# Patient Record
Sex: Male | Born: 1980 | Race: Black or African American | Hispanic: No | Marital: Married | Smoking: Never smoker
Health system: Southern US, Community
[De-identification: ages and names within clinical notes are randomized; demographics above are authoritative.]

## PROBLEM LIST (undated history)

## (undated) DIAGNOSIS — T7840XA Allergy, unspecified, initial encounter: Secondary | ICD-10-CM

## (undated) HISTORY — PX: OTHER SURGICAL HISTORY: SHX169

## (undated) HISTORY — DX: Allergy, unspecified, initial encounter: T78.40XA

---

## 2013-06-06 ENCOUNTER — Encounter (HOSPITAL_COMMUNITY): Payer: Self-pay | Admitting: Emergency Medicine

## 2013-06-06 ENCOUNTER — Emergency Department (HOSPITAL_COMMUNITY)
Admission: EM | Admit: 2013-06-06 | Discharge: 2013-06-06 | Disposition: A | Discharge status: Home / Self Care | Payer: No Typology Code available for payment source | Attending: Emergency Medicine | Admitting: Emergency Medicine

## 2013-06-06 DIAGNOSIS — Y92410 Unspecified street and highway as the place of occurrence of the external cause: Secondary | ICD-10-CM | POA: Insufficient documentation

## 2013-06-06 DIAGNOSIS — M542 Cervicalgia: Secondary | ICD-10-CM

## 2013-06-06 DIAGNOSIS — M545 Low back pain, unspecified: Secondary | ICD-10-CM

## 2013-06-06 DIAGNOSIS — Y9389 Activity, other specified: Secondary | ICD-10-CM | POA: Insufficient documentation

## 2013-06-06 DIAGNOSIS — IMO0002 Reserved for concepts with insufficient information to code with codable children: Secondary | ICD-10-CM | POA: Insufficient documentation

## 2013-06-06 DIAGNOSIS — S0993XA Unspecified injury of face, initial encounter: Secondary | ICD-10-CM | POA: Insufficient documentation

## 2013-06-06 MED ORDER — DIAZEPAM 5 MG PO TABS: 5.0000 mg | ORAL_TABLET | Freq: Two times a day (BID) | ORAL | Status: DC

## 2013-06-06 MED ORDER — IBUPROFEN 600 MG PO TABS: 600.0000 mg | ORAL_TABLET | Freq: Four times a day (QID) | ORAL | Status: DC | PRN
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
## 2013-06-07 NOTE — ED Provider Notes (Signed)
Medical screening examination/treatment/procedure(s) were performed by non-physician practitioner and as supervising physician I was immediately available for consultation/collaboration.   Audree Camel, MD 06/07/13 [phone]

## 2013-06-17 ENCOUNTER — Ambulatory Visit: Payer: 59 | Attending: Internal Medicine | Admitting: Physical Therapy

## 2013-06-17 DIAGNOSIS — M545 Low back pain, unspecified: Secondary | ICD-10-CM | POA: Insufficient documentation

## 2013-06-17 DIAGNOSIS — M542 Cervicalgia: Secondary | ICD-10-CM | POA: Insufficient documentation

## 2013-06-17 DIAGNOSIS — IMO0001 Reserved for inherently not codable concepts without codable children: Secondary | ICD-10-CM | POA: Insufficient documentation

## 2013-06-24 ENCOUNTER — Ambulatory Visit: Payer: 59 | Admitting: Physical Therapy

## 2013-06-26 ENCOUNTER — Ambulatory Visit: Payer: 59 | Admitting: Physical Therapy

## 2016-09-11 ENCOUNTER — Other Ambulatory Visit (HOSPITAL_COMMUNITY): Payer: Self-pay | Admitting: Urology

## 2016-09-11 DIAGNOSIS — E291 Testicular hypofunction: Secondary | ICD-10-CM

## 2016-09-20 ENCOUNTER — Ambulatory Visit (HOSPITAL_COMMUNITY)
Admission: RE | Admit: 2016-09-20 | Discharge: 2016-09-20 | Disposition: A | Discharge status: Home / Self Care | Payer: BLUE CROSS/BLUE SHIELD | Source: Ambulatory Visit | Attending: Urology | Admitting: Urology

## 2016-09-20 DIAGNOSIS — E291 Testicular hypofunction: Secondary | ICD-10-CM | POA: Insufficient documentation

## 2016-09-20 MED ORDER — GADOBENATE DIMEGLUMINE 529 MG/ML IV SOLN
10.0000 mL | Freq: Once | INTRAVENOUS | Status: AC | PRN
  Administered 2016-09-20: 8 mL via INTRAVENOUS

## 2016-09-29 DIAGNOSIS — N4611 Organic oligospermia: Secondary | ICD-10-CM | POA: Diagnosis not present

## 2016-09-29 DIAGNOSIS — E291 Testicular hypofunction: Secondary | ICD-10-CM | POA: Diagnosis not present

## 2016-09-29 DIAGNOSIS — N5201 Erectile dysfunction due to arterial insufficiency: Secondary | ICD-10-CM | POA: Diagnosis not present

## 2016-11-03 ENCOUNTER — Encounter: Payer: BLUE CROSS/BLUE SHIELD | Admitting: Internal Medicine

## 2016-11-03 DIAGNOSIS — J309 Allergic rhinitis, unspecified: Secondary | ICD-10-CM | POA: Diagnosis not present

## 2016-11-03 DIAGNOSIS — E785 Hyperlipidemia, unspecified: Secondary | ICD-10-CM | POA: Diagnosis not present

## 2016-11-03 DIAGNOSIS — E559 Vitamin D deficiency, unspecified: Secondary | ICD-10-CM | POA: Diagnosis not present

## 2016-11-03 DIAGNOSIS — Z131 Encounter for screening for diabetes mellitus: Secondary | ICD-10-CM | POA: Diagnosis not present

## 2016-11-03 DIAGNOSIS — Z113 Encounter for screening for infections with a predominantly sexual mode of transmission: Secondary | ICD-10-CM | POA: Diagnosis not present

## 2016-11-03 DIAGNOSIS — Z0000 Encounter for general adult medical examination without abnormal findings: Secondary | ICD-10-CM | POA: Diagnosis not present

## 2016-11-03 DIAGNOSIS — Z136 Encounter for screening for cardiovascular disorders: Secondary | ICD-10-CM | POA: Diagnosis not present

## 2016-11-03 DIAGNOSIS — R7303 Prediabetes: Secondary | ICD-10-CM | POA: Diagnosis not present

## 2016-11-03 DIAGNOSIS — Z01118 Encounter for examination of ears and hearing with other abnormal findings: Secondary | ICD-10-CM | POA: Diagnosis not present

## 2016-11-03 DIAGNOSIS — E291 Testicular hypofunction: Secondary | ICD-10-CM | POA: Diagnosis not present

## 2016-12-15 DIAGNOSIS — J309 Allergic rhinitis, unspecified: Secondary | ICD-10-CM | POA: Diagnosis not present

## 2016-12-15 DIAGNOSIS — R079 Chest pain, unspecified: Secondary | ICD-10-CM | POA: Diagnosis not present

## 2016-12-15 DIAGNOSIS — R7303 Prediabetes: Secondary | ICD-10-CM | POA: Diagnosis not present

## 2016-12-15 DIAGNOSIS — E291 Testicular hypofunction: Secondary | ICD-10-CM | POA: Diagnosis not present

## 2016-12-15 DIAGNOSIS — E785 Hyperlipidemia, unspecified: Secondary | ICD-10-CM | POA: Diagnosis not present

## 2017-01-04 ENCOUNTER — Ambulatory Visit (INDEPENDENT_AMBULATORY_CARE_PROVIDER_SITE_OTHER): Payer: BLUE CROSS/BLUE SHIELD | Admitting: Family Medicine

## 2017-01-04 ENCOUNTER — Encounter: Payer: Self-pay | Admitting: Family Medicine

## 2017-01-04 VITALS — BP 128/84 | HR 82 | Temp 98.7°F | Resp 16 | Ht 72.0 in | Wt 178.0 lb

## 2017-01-04 DIAGNOSIS — R079 Chest pain, unspecified: Secondary | ICD-10-CM

## 2017-01-04 NOTE — Patient Instructions (Addendum)
It was very good to see you today.  I do think that the chest pain is coming from the muscles or cartilage of your chest, which is not bad.  However you do have an abnormal EKG. Because of this plus the pain in your chest, I'm going to send you to a heart doctor.  They will call you next week to set up an appointment to see you.  If you start having worsening chest pain, crushing chest pain, chest pain that does not go away, difficulty breathing, chest pain with lightheadedness or nausea or vomiting, you should go to the emergency room immediately.    IF you received an x-ray today, you will receive an invoice from [facility]. Please contact [facility] at [phone] with questions or concerns regarding your invoice.   IF you received labwork today, you will receive an invoice from [facility]. Please contact [facility] at [phone] with questions or concerns regarding your invoice.   Our billing staff will not be able to assist you with questions regarding bills from these companies.  You will be contacted with the lab results as soon as they are available. The fastest way to get your results is to activate your My Chart account. Instructions are located on the last page of this paperwork. If you have not heard from us regarding the results in 2 weeks, please contact this office.

## 2017-01-04 NOTE — Progress Notes (Signed)
   Allen [name] is a 36 y.o. male who presents to [facility] at [facility] today for chest pain:  1.  Chest pain:  Patient describes about 2 weeks of chest pain. It is intermittent in nature. He notices it mostly when he is stretches and he describes this as a "pulling" in his chest. It lasts for 1-5 minutes and then resolves. He has never had to take anything over-the-counter for this.  He is not doing exercise. No injury to his chest.  Has pain fleetingly this AM when he awoke, but none since then.  Several days since last episode of pain.    He does have another primary care physician. He was prescribed an unknown medication but states this did not help. He denies any dyspnea on exertion. No chest pain on exertion.  When he is having the chest pain he does describe blurry vision and "severe" headache. Again this revision headache resolves with this chest pain resolves. He denies any palpitations. No fevers or chills. No cough. He has no nausea vomiting when he experiences the chest pain.  Social: He is a never smoker. Occasional alcohol use. No illicit drug use.  Family history: Father with hypertension. He otherwise denies any family history of medical issues. Siblings are all healthy. No history of heart disease in family that he knows of.  ROS as above.     PMH reviewed. Patient is a nonsmoker.   No past medical history on file. No past surgical history on file.  Medications reviewed.    Physical Exam:  BP 128/84   Pulse 82   Temp 98.7 F (37.1 C) (Oral)   Resp 16   Ht 6' (1.829 m)   Wt 178 lb (80.7 kg)   SpO2 97%   BMI 24.14 kg/m  Gen:  Alert, cooperative patient who appears stated age in no acute distress.  Vital signs reviewed. HEENT: EOMI,  MMM Neck:  No JVD Pulm:  Clear to auscultation bilaterally with good air movement.  No wheezes or rales noted.   Cardiac:  Regular rate and rhythm without murmur auscultated.  Good S1/S2. Chest;  I cannot reproduce his pain with  palpation of his chest. Abd:  Soft/nondistended/nontender.   Exts: Non edematous BL  LE, warm and well perfused.  Neuro:  Awake and alert.  No focal deficits.   Assessment and Plan:  1.  Chest pain - Atypical chest pain. -Sounds mostly musculoskeletal.  Very low risk patient as he is the family history, personal history of cardiac disease. Never smoker. No hypertension or diabetes. -EKG was obtained before I saw the patient. He does have flipped T waves in his inferior leads. He also has evidence of left atrial enlargement on EKG. - He has none of the typical symptoms of chest pain, but he does have an abnormal EKG. -Because of this abnormal EKG plan to refer him to cardiologist for further evaluation.  -We are obtaining a complete metabolic profile, CBC, TSH today.  Also checking lipids.   - red flags/return precautions provided.

## 2017-01-05 LAB — COMPREHENSIVE METABOLIC PANEL
ALT: 22 IU/L (ref 0–44)
AST: 20 IU/L (ref 0–40)
Albumin/Globulin Ratio: 1.5 (ref 1.2–2.2)
Albumin: 4.3 g/dL (ref 3.5–5.5)
Alkaline Phosphatase: 67 IU/L (ref 39–117)
BUN/Creatinine Ratio: 16 (ref 9–20)
BUN: 16 mg/dL (ref 6–20)
Bilirubin Total: 0.3 mg/dL (ref 0.0–1.2)
CO2: 26 mmol/L (ref 18–29)
Calcium: 9.4 mg/dL (ref 8.7–10.2)
Chloride: 102 mmol/L (ref 96–106)
Creatinine, Ser: 0.99 mg/dL (ref 0.76–1.27)
GFR calc Af Amer: 115 mL/min/{1.73_m2} (ref >59)
GFR calc non Af Amer: 100 mL/min/{1.73_m2} (ref >59)
Globulin, Total: 2.8 g/dL (ref 1.5–4.5)
Glucose: 94 mg/dL (ref 65–99)
Potassium: 4.4 mmol/L (ref 3.5–5.2)
Sodium: 141 mmol/L (ref 134–144)
Total Protein: 7.1 g/dL (ref 6.0–8.5)

## 2017-01-05 LAB — CBC
Hematocrit: 47.8 % (ref 37.5–51.0)
Hemoglobin: 15.5 g/dL (ref 13.0–17.7)
MCH: 29 pg (ref 26.6–33.0)
MCHC: 32.4 g/dL (ref 31.5–35.7)
MCV: 90 fL (ref 79–97)
Platelets: 190 10*3/uL (ref 150–379)
RBC: 5.34 x10E6/uL (ref 4.14–5.80)
RDW: 15 % (ref 12.3–15.4)
WBC: 5.1 10*3/uL (ref 3.4–10.8)

## 2017-01-05 LAB — LIPID PANEL
Chol/HDL Ratio: 3.1 ratio (ref 0.0–5.0)
Cholesterol, Total: 178 mg/dL (ref 100–199)
HDL: 58 mg/dL (ref >39)
LDL Calculated: 99 mg/dL (ref 0–99)
Triglycerides: 104 mg/dL (ref 0–149)
VLDL Cholesterol Cal: 21 mg/dL (ref 5–40)

## 2017-01-05 LAB — TSH: TSH: 0.901 u[IU]/mL (ref 0.450–4.500)

## 2017-01-22 DIAGNOSIS — E7800 Pure hypercholesterolemia, unspecified: Secondary | ICD-10-CM | POA: Diagnosis not present

## 2017-01-22 DIAGNOSIS — R0789 Other chest pain: Secondary | ICD-10-CM | POA: Diagnosis not present

## 2017-01-22 DIAGNOSIS — R9431 Abnormal electrocardiogram [ECG] [EKG]: Secondary | ICD-10-CM | POA: Diagnosis not present

## 2017-01-22 DIAGNOSIS — R030 Elevated blood-pressure reading, without diagnosis of hypertension: Secondary | ICD-10-CM | POA: Diagnosis not present

## 2017-01-22 DIAGNOSIS — N5201 Erectile dysfunction due to arterial insufficiency: Secondary | ICD-10-CM | POA: Diagnosis not present

## 2017-01-26 DIAGNOSIS — N5201 Erectile dysfunction due to arterial insufficiency: Secondary | ICD-10-CM | POA: Diagnosis not present

## 2017-01-26 DIAGNOSIS — E291 Testicular hypofunction: Secondary | ICD-10-CM | POA: Diagnosis not present

## 2017-01-26 DIAGNOSIS — N4611 Organic oligospermia: Secondary | ICD-10-CM | POA: Diagnosis not present

## 2017-02-05 DIAGNOSIS — R0789 Other chest pain: Secondary | ICD-10-CM | POA: Diagnosis not present

## 2017-02-05 DIAGNOSIS — R9431 Abnormal electrocardiogram [ECG] [EKG]: Secondary | ICD-10-CM | POA: Diagnosis not present

## 2017-02-15 DIAGNOSIS — R0789 Other chest pain: Secondary | ICD-10-CM | POA: Diagnosis not present

## 2017-02-15 DIAGNOSIS — R9431 Abnormal electrocardiogram [ECG] [EKG]: Secondary | ICD-10-CM | POA: Diagnosis not present

## 2017-02-23 DIAGNOSIS — E7800 Pure hypercholesterolemia, unspecified: Secondary | ICD-10-CM | POA: Diagnosis not present

## 2017-02-23 DIAGNOSIS — R030 Elevated blood-pressure reading, without diagnosis of hypertension: Secondary | ICD-10-CM | POA: Diagnosis not present

## 2017-02-23 DIAGNOSIS — R9431 Abnormal electrocardiogram [ECG] [EKG]: Secondary | ICD-10-CM | POA: Diagnosis not present

## 2017-02-23 DIAGNOSIS — R0789 Other chest pain: Secondary | ICD-10-CM | POA: Diagnosis not present

## 2017-05-23 DIAGNOSIS — Z31430 Encounter of female for testing for genetic disease carrier status for procreative management: Secondary | ICD-10-CM | POA: Diagnosis not present

## 2017-05-23 DIAGNOSIS — E291 Testicular hypofunction: Secondary | ICD-10-CM | POA: Diagnosis not present

## 2017-05-23 DIAGNOSIS — E282 Polycystic ovarian syndrome: Secondary | ICD-10-CM | POA: Diagnosis not present

## 2017-05-23 DIAGNOSIS — Z319 Encounter for procreative management, unspecified: Secondary | ICD-10-CM | POA: Diagnosis not present

## 2017-07-16 ENCOUNTER — Encounter: Payer: Self-pay | Admitting: Emergency Medicine

## 2017-07-16 ENCOUNTER — Ambulatory Visit (INDEPENDENT_AMBULATORY_CARE_PROVIDER_SITE_OTHER): Payer: BLUE CROSS/BLUE SHIELD

## 2017-07-16 ENCOUNTER — Other Ambulatory Visit: Payer: Self-pay

## 2017-07-16 ENCOUNTER — Ambulatory Visit: Payer: BLUE CROSS/BLUE SHIELD | Admitting: Emergency Medicine

## 2017-07-16 VITALS — BP 128/76 | HR 76 | Temp 98.3°F | Resp 16 | Ht 70.0 in | Wt 173.4 lb

## 2017-07-16 DIAGNOSIS — R079 Chest pain, unspecified: Secondary | ICD-10-CM | POA: Diagnosis not present

## 2017-07-16 NOTE — Patient Instructions (Addendum)
   IF you received an x-ray today, you will receive an invoice from [facility]. Please contact [facility] at [phone] with questions or concerns regarding your invoice.   IF you received labwork today, you will receive an invoice from [facility]. Please contact [facility] at [phone] with questions or concerns regarding your invoice.   Our billing staff will not be able to assist you with questions regarding bills from these companies.  You will be contacted with the lab results as soon as they are available. The fastest way to get your results is to activate your My Chart account. Instructions are located on the last page of this paperwork. If you have not heard from us regarding the results in 2 weeks, please contact this office.     Nonspecific Chest Pain Chest pain can be caused by many different conditions. There is a chance that your pain could be related to something serious, such as a heart attack or a blood clot in your lungs. Chest pain can also be caused by conditions that are not life-threatening. If you have chest pain, it is very important to follow up with your doctor. Follow these instructions at home: Medicines  If you were prescribed an antibiotic medicine, take it as told by your doctor. Do not stop taking the antibiotic even if you start to feel better.  Take over-the-counter and prescription medicines only as told by your doctor. Lifestyle  Do not use any products that contain nicotine or tobacco, such as cigarettes and e-cigarettes. If you need help quitting, ask your doctor.  Do not drink alcohol.  Make lifestyle changes as told by your doctor. These may include: ? Getting regular exercise. Ask your doctor for some activities that are safe for you. ? Eating a heart-healthy diet. A diet specialist (dietitian) can help you to learn healthy eating options. ? Staying at a healthy weight. ? Managing diabetes, if needed. ? Lowering your  stress, as with deep breathing or spending time in nature. General instructions  Avoid any activities that make you feel chest pain.  If your chest pain is because of heartburn: ? Raise (elevate) the head of your bed about 6 inches (15 cm). You can do this by putting blocks under the bed legs at the head of the bed. ? Do not sleep with extra pillows under your head. That does not help heartburn.  Keep all follow-up visits as told by your doctor. This is important. This includes any further testing if your chest pain does not go away. Contact a doctor if:  Your chest pain does not go away.  You have a rash with blisters on your chest.  You have a fever.  You have chills. Get help right away if:  Your chest pain is worse.  You have a cough that gets worse, or you cough up blood.  You have very bad (severe) pain in your belly (abdomen).  You are very weak.  You pass out (faint).  You have either of these for no clear reason: ? Sudden chest discomfort. ? Sudden discomfort in your arms, back, neck, or jaw.  You have shortness of breath at any time.  You suddenly start to sweat, or your skin gets clammy.  You feel sick to your stomach (nauseous).  You throw up (vomit).  You suddenly feel light-headed or dizzy.  Your heart starts to beat fast, or it feels like it is skipping beats. These symptoms may be an emergency. Do not wait   to see if the symptoms will go away. Get medical help right away. Call your local emergency services (911 in the [place] Do not drive yourself to the hospital. This information is not intended to replace advice given to you by your health care provider. Make sure you discuss any questions you have with your health care provider. Document Released: 02/07/2008 Document Revised: 05/15/2016 Document Reviewed: 05/15/2016 Elsevier Interactive Patient Education  2017 Elsevier Inc.  

## 2017-07-16 NOTE — Progress Notes (Signed)
Allen [name] 36 y.o.   Chief Complaint  Patient presents with  . Chest Pain    x 1 week radiating to the back     HISTORY OF PRESENT ILLNESS: This is a 36 y.o. male complaining of chest pain x 1 week similar to one he was evaluated for last May; was seen here and referred to Cardiology; had stress test and Echocardiogram done last summer and told by cardiologist everything was fine. Pain is sharp and pleuritic without associated symptoms. No CAD risk factors.  Chest Pain   This is a recurrent problem. The current episode started 1 to 4 weeks ago. The onset quality is undetermined. The problem occurs intermittently. The problem has been waxing and waning. The pain is present in the substernal region. The pain is at a severity of 2/10. The pain is mild. The quality of the pain is described as sharp. The pain radiates to the upper back and mid back. Associated symptoms include headaches. Pertinent negatives include no abdominal pain, back pain, claudication, cough, diaphoresis, dizziness, exertional chest pressure, fever, hemoptysis, irregular heartbeat, lower extremity edema, malaise/fatigue, nausea, numbness, palpitations, shortness of breath, syncope, vomiting or weakness.     Prior to Admission medications   Medication Sig Start Date End Date Taking? Authorizing Provider  ibuprofen (ADVIL,MOTRIN) 600 MG tablet Take 1 tablet (600 mg total) by mouth every 6 (six) hours as needed for pain. 06/06/13  Yes Lurene Shadow, PA-C  acetaminophen (TYLENOL) 325 MG tablet Take 650 mg by mouth every 6 (six) hours as needed for pain (pain).    [provider]  clomiPHENE (CLOMID) 50 MG tablet  10/30/16   [provider]  diazepam (VALIUM) 5 MG tablet Take 1 tablet (5 mg total) by mouth 2 (two) times daily. Patient not taking: Reported on 01/04/2017 06/06/13   Lurene Shadow, PA-C    No Known Allergies  There are no active problems to display for this patient.   No past medical history  on file.  No past surgical history on file.  Social History   Socioeconomic History  . Marital status: Married    Spouse name: Not on file  . Number of children: Not on file  . Years of education: Not on file  . Highest education level: Not on file  Social Needs  . Financial resource strain: Not on file  . Food insecurity - worry: Not on file  . Food insecurity - inability: Not on file  . Transportation needs - medical: Not on file  . Transportation needs - non-medical: Not on file  Occupational History  . Not on file  Tobacco Use  . Smoking status: Never Smoker  . Smokeless tobacco: Never Used  Substance and Sexual Activity  . Alcohol use: Yes    Comment: Occassionlly   . Drug use: No  . Sexual activity: Not on file  Other Topics Concern  . Not on file  Social History Narrative  . Not on file    No family history on file.   Review of Systems  Constitutional: Negative for diaphoresis, fever, malaise/fatigue and weight loss.  HENT: Negative.  Negative for sore throat.   Eyes: Negative.  Negative for discharge and redness.  Respiratory: Negative for cough, hemoptysis, shortness of breath and wheezing.   Cardiovascular: Positive for chest pain. Negative for palpitations, claudication, leg swelling and syncope.  Gastrointestinal: Negative for abdominal pain, blood in stool, diarrhea, nausea and vomiting.  Genitourinary: Negative for dysuria and hematuria.  Musculoskeletal: Negative for back pain, myalgias and neck pain.  Skin: Negative.  Negative for rash.  Neurological: Positive for headaches. Negative for dizziness, sensory change, focal weakness, weakness and numbness.  Endo/Heme/Allergies: Negative.   All other systems reviewed and are negative.  Vitals:   07/16/17 1208  BP: 128/76  Pulse: 76  Resp: 16  Temp: 98.3 F (36.8 C)  SpO2: 99%     Physical Exam  Constitutional: He is oriented to person, place, and time. He appears well-developed and  well-nourished.  HENT:  Head: Normocephalic and atraumatic.  Nose: Nose normal.  Mouth/Throat: Oropharynx is clear and moist.  Eyes: Conjunctivae and EOM are normal. Pupils are equal, round, and reactive to light.  Neck: Normal range of motion. Neck supple. No JVD present.  Cardiovascular: Normal rate, regular rhythm, normal heart sounds and intact distal pulses.  Pulmonary/Chest: Effort normal and breath sounds normal.  Abdominal: Soft. Bowel sounds are normal. He exhibits no distension. There is no tenderness.  Musculoskeletal: Normal range of motion.  Neurological: He is alert and oriented to person, place, and time. No sensory deficit. He exhibits normal muscle tone.  Skin: Skin is warm and dry. Capillary refill takes less than 2 seconds. No rash noted.  Psychiatric: He has a normal mood and affect. His behavior is normal.  Vitals reviewed.  Dg Chest 2 View  Result Date: 07/16/2017 CLINICAL DATA:  Chest pain.  Nonsmoker. EXAM: CHEST  2 VIEW COMPARISON:  None in PACs FINDINGS: The lungs are adequately inflated. There is no focal infiltrate. There is no alveolar infiltrate or pleural effusion. The heart and pulmonary vascularity are normal. The trachea is midline. The bony thorax is unremarkable. IMPRESSION: There is no acute cardiopulmonary abnormality. Electronically Signed   By: David  SwazilandJordan M.D.   On: 07/16/2017 12:39    NSR with VR65 No acute ischemic changes PR114 QRS368 compared with5/3/18; no changes.  ASSESSMENT & PLAN: Allen DeutscherMartin was seen today for chest pain.  Diagnoses and all orders for this visit:  Chest pain, unspecified type -     EKG 12-Lead -     DG Chest 2 View; Future    Patient Instructions       IF you received an x-ray today, you will receive an invoice from [facility]. Please contact [facility] at [phone] with questions or concerns regarding your invoice.   IF you received labwork today, you will receive an invoice from  [facility]. Please contact [facility] at [phone] with questions or concerns regarding your invoice.   Our billing staff will not be able to assist you with questions regarding bills from these companies.  You will be contacted with the lab results as soon as they are available. The fastest way to get your results is to activate your My Chart account. Instructions are located on the last page of this paperwork. If you have not heard from us regarding the results in 2 weeks, please contact this office.     Nonspecific Chest Pain Chest pain can be caused by many different conditions. There is a chance that your pain could be related to something serious, such as a heart attack or a blood clot in your lungs. Chest pain can also be caused by conditions that are not life-threatening. If you have chest pain, it is very important to follow up with your doctor. Follow these instructions at home: Medicines  If you were prescribed an antibiotic medicine, take it as told by your doctor. Do not stop taking  the antibiotic even if you start to feel better.  Take over-the-counter and prescription medicines only as told by your doctor. Lifestyle  Do not use any products that contain nicotine or tobacco, such as cigarettes and e-cigarettes. If you need help quitting, ask your doctor.  Do not drink alcohol.  Make lifestyle changes as told by your doctor. These may include: ? Getting regular exercise. Ask your doctor for some activities that are safe for you. ? Eating a heart-healthy diet. A diet specialist (dietitian) can help you to learn healthy eating options. ? Staying at a healthy weight. ? Managing diabetes, if needed. ? Lowering your stress, as with deep breathing or spending time in nature. General instructions  Avoid any activities that make you feel chest pain.  If your chest pain is because of heartburn: ? Raise (elevate) the head of your bed about 6 inches (15 cm). You can do this by  putting blocks under the bed legs at the head of the bed. ? Do not sleep with extra pillows under your head. That does not help heartburn.  Keep all follow-up visits as told by your doctor. This is important. This includes any further testing if your chest pain does not go away. Contact a doctor if:  Your chest pain does not go away.  You have a rash with blisters on your chest.  You have a fever.  You have chills. Get help right away if:  Your chest pain is worse.  You have a cough that gets worse, or you cough up blood.  You have very bad (severe) pain in your belly (abdomen).  You are very weak.  You pass out (faint).  You have either of these for no clear reason: ? Sudden chest discomfort. ? Sudden discomfort in your arms, back, neck, or jaw.  You have shortness of breath at any time.  You suddenly start to sweat, or your skin gets clammy.  You feel sick to your stomach (nauseous).  You throw up (vomit).  You suddenly feel light-headed or dizzy.  Your heart starts to beat fast, or it feels like it is skipping beats. These symptoms may be an emergency. Do not wait to see if the symptoms will go away. Get medical help right away. Call your local emergency services (911 in the [place] Do not drive yourself to the hospital. This information is not intended to replace advice given to you by your health care provider. Make sure you discuss any questions you have with your health care provider. Document Released: 02/07/2008 Document Revised: 05/15/2016 Document Reviewed: 05/15/2016 Elsevier Interactive Patient Education  2017 Elsevier Inc.      Edwina BarthMiguel Kaylum Shrum, MD [facility]

## 2017-12-13 ENCOUNTER — Encounter: Payer: BLUE CROSS/BLUE SHIELD | Admitting: Podiatry

## 2017-12-13 ENCOUNTER — Ambulatory Visit: Payer: BLUE CROSS/BLUE SHIELD

## 2018-01-02 NOTE — Progress Notes (Signed)
This encounter was created in error - please disregard.

## 2018-04-18 DIAGNOSIS — M21612 Bunion of left foot: Secondary | ICD-10-CM | POA: Diagnosis not present

## 2018-04-18 DIAGNOSIS — M21961 Unspecified acquired deformity of right lower leg: Secondary | ICD-10-CM | POA: Diagnosis not present

## 2018-04-18 DIAGNOSIS — M21611 Bunion of right foot: Secondary | ICD-10-CM | POA: Diagnosis not present

## 2018-04-18 DIAGNOSIS — M21962 Unspecified acquired deformity of left lower leg: Secondary | ICD-10-CM | POA: Diagnosis not present

## 2018-07-05 ENCOUNTER — Emergency Department (HOSPITAL_COMMUNITY)
Admission: EM | Admit: 2018-07-05 | Discharge: 2018-07-06 | Disposition: A | Discharge status: Home / Self Care | Payer: BLUE CROSS/BLUE SHIELD | Attending: Emergency Medicine | Admitting: Emergency Medicine

## 2018-07-05 ENCOUNTER — Encounter (HOSPITAL_COMMUNITY): Payer: Self-pay | Admitting: Emergency Medicine

## 2018-07-05 ENCOUNTER — Emergency Department (HOSPITAL_COMMUNITY): Payer: BLUE CROSS/BLUE SHIELD

## 2018-07-05 DIAGNOSIS — R51 Headache: Secondary | ICD-10-CM | POA: Diagnosis not present

## 2018-07-05 DIAGNOSIS — H538 Other visual disturbances: Secondary | ICD-10-CM | POA: Insufficient documentation

## 2018-07-05 DIAGNOSIS — R519 Headache, unspecified: Secondary | ICD-10-CM

## 2018-07-05 LAB — COMPREHENSIVE METABOLIC PANEL
ALBUMIN: 4.4 g/dL (ref 3.5–5.0)
ALT: 23 U/L (ref 0–44)
ANION GAP: 6 (ref 5–15)
AST: 22 U/L (ref 15–41)
Alkaline Phosphatase: 57 U/L (ref 38–126)
BILIRUBIN TOTAL: 0.8 mg/dL (ref 0.3–1.2)
BUN: 14 mg/dL (ref 6–20)
CALCIUM: 9.6 mg/dL (ref 8.9–10.3)
CO2: 29 mmol/L (ref 22–32)
CREATININE: 1.13 mg/dL (ref 0.61–1.24)
Chloride: 102 mmol/L (ref 98–111)
GFR calc Af Amer: >60 mL/min (ref >60)
GFR calc non Af Amer: >60 mL/min (ref >60)
GLUCOSE: 110 mg/dL — AB (ref 70–99)
Potassium: 4.6 mmol/L (ref 3.5–5.1)
Sodium: 137 mmol/L (ref 135–145)
Total Protein: 7.9 g/dL (ref 6.5–8.1)

## 2018-07-05 LAB — CBC
HCT: 46.6 % (ref 39.0–52.0)
Hemoglobin: 15.4 g/dL (ref 13.0–17.0)
MCH: 28.6 pg (ref 26.0–34.0)
MCHC: 33 g/dL (ref 30.0–36.0)
MCV: 86.5 fL (ref 80.0–100.0)
Platelets: 186 10*3/uL (ref 150–400)
RBC: 5.39 MIL/uL (ref 4.22–5.81)
RDW: 13.1 % (ref 11.5–15.5)
WBC: 6.2 10*3/uL (ref 4.0–10.5)
nRBC: 0 % (ref 0.0–0.2)

## 2018-07-05 LAB — PROTIME-INR
INR: 1.05
PROTHROMBIN TIME: 13.6 s (ref 11.4–15.2)

## 2018-07-05 LAB — I-STAT TROPONIN, ED: Troponin i, poc: 0 ng/mL (ref 0.00–0.08)

## 2018-07-05 LAB — DIFFERENTIAL
ABS IMMATURE GRANULOCYTES: 0.01 10*3/uL (ref 0.00–0.07)
BASOS ABS: 0 10*3/uL (ref 0.0–0.1)
Basophils Relative: 0 %
Eosinophils Absolute: 0.2 10*3/uL (ref 0.0–0.5)
Eosinophils Relative: 4 %
Immature Granulocytes: 0 %
LYMPHS ABS: 1.7 10*3/uL (ref 0.7–4.0)
Lymphocytes Relative: 28 %
Monocytes Absolute: 0.3 10*3/uL (ref 0.1–1.0)
Monocytes Relative: 5 %
NEUTROS ABS: 3.9 10*3/uL (ref 1.7–7.7)
Neutrophils Relative %: 63 %

## 2018-07-05 LAB — APTT: aPTT: 34 seconds (ref 24–36)

## 2018-07-05 NOTE — ED Provider Notes (Signed)
[facility] EMERGENCY DEPARTMENT Provider Note   CSN: [id number] Arrival date & time: 07/05/18  2118     History   Chief Complaint Chief Complaint  Patient presents with  . Headache    HPI Allen [name] is a 37 y.o. male.  Patient presents to the ER because he had an episode of sudden onset headache and blurred vision while he was lifting weights.  Patient reports that he just finished performing bench press, put the weight back and then had sudden onset of the symptoms.  This has slowly improved over time and he is now essentially back to his baseline.  He did have his blood pressure checked at the time of onset and it was somewhat elevated, does not have a history of hypertension.  Patient denies any chest pain, heart palpitations, shortness of breath, passing out.     History reviewed. No pertinent past medical history.  Patient Active Problem List   Diagnosis Date Noted  . Chest pain 07/16/2017    History reviewed. No pertinent surgical history.      Home Medications    Prior to Admission medications   Medication Sig Start Date End Date Taking? Authorizing Provider  diazepam (VALIUM) 5 MG tablet Take 1 tablet (5 mg total) by mouth 2 (two) times daily. Patient not taking: Reported on 01/04/2017 06/06/13   Lurene Shadow, PA-C  ibuprofen (ADVIL,MOTRIN) 600 MG tablet Take 1 tablet (600 mg total) by mouth every 6 (six) hours as needed for pain. Patient not taking: Reported on 07/05/2018 06/06/13   Rolla Plate    Family History No family history on file.  Social History Social History   Tobacco Use  . Smoking status: Never Smoker  . Smokeless tobacco: Never Used  Substance Use Topics  . Alcohol use: Yes    Comment: Occassionlly   . Drug use: No     Allergies   Patient has no known allergies.   Review of Systems Review of Systems  Eyes: Positive for visual disturbance.  Neurological: Positive for headaches.  All other systems  reviewed and are negative.    Physical Exam Updated Vital Signs BP (!) 130/94 (BP Location: Right Arm)   Pulse (!) 56   Temp 98.1 F (36.7 C) (Oral)   Resp 16   Ht 5\' 6"  (1.676 m)   Wt 77.1 kg   SpO2 100%   BMI 27.44 kg/m   Physical Exam  Constitutional: He is oriented to person, place, and time. He appears well-developed and well-nourished. No distress.  HENT:  Head: Normocephalic and atraumatic.  Right Ear: Hearing normal.  Left Ear: Hearing normal.  Nose: Nose normal.  Mouth/Throat: Oropharynx is clear and moist and mucous membranes are normal.  Eyes: Pupils are equal, round, and reactive to light. Conjunctivae and EOM are normal.  Neck: Normal range of motion. Neck supple.  Cardiovascular: Regular rhythm, S1 normal and S2 normal. Exam reveals no gallop and no friction rub.  No murmur heard. Pulmonary/Chest: Effort normal and breath sounds normal. No respiratory distress. He exhibits no tenderness.  Abdominal: Soft. Normal appearance and bowel sounds are normal. There is no hepatosplenomegaly. There is no tenderness. There is no rebound, no guarding, no tenderness at McBurney's point and negative Murphy's sign. No hernia.  Musculoskeletal: Normal range of motion.  Neurological: He is alert and oriented to person, place, and time. He has normal strength. No cranial nerve deficit or sensory deficit. Coordination normal. GCS eye subscore is 4.  GCS verbal subscore is 5. GCS motor subscore is 6.  Extraocular muscle movement: normal No visual field cut Pupils: equal and reactive both direct and consensual response is normal No nystagmus present    Sensory function is intact to light touch, pinprick Proprioception intact  Grip strength 5/5 symmetric in upper extremities No pronator drift Normal finger to nose bilaterally  Lower extremity strength 5/5 against gravity Normal heel to shin bilaterally  Gait: normal   Skin: Skin is warm, dry and intact. No rash noted. No  cyanosis.  Psychiatric: He has a normal mood and affect. His speech is normal and behavior is normal. Thought content normal.  Nursing note and vitals reviewed.    ED Treatments / Results  Labs (all labs ordered are listed, but only abnormal results are displayed) Labs Reviewed  COMPREHENSIVE METABOLIC PANEL - Abnormal; Notable for the following components:      Result Value   Glucose, Bld 110 (*)    All other components within normal limits  PROTIME-INR  APTT  CBC  DIFFERENTIAL  I-STAT TROPONIN, ED    EKG EKG Interpretation  Date/Time:  Friday July 05 2018 21:38:24 EDT Ventricular Rate:  58 PR Interval:  128 QRS Duration: 72 QT Interval:  404 QTC Calculation: 396 R Axis:   81 Text Interpretation:  Sinus bradycardia Biatrial enlargement Abnormal ECG Confirmed by Gilda Crease [phone]) on 07/05/2018 11:19:44 PM   Radiology Ct Head Wo Contrast  Result Date: 07/05/2018 CLINICAL DATA:  37 y/o  M; acute headache. EXAM: CT HEAD WITHOUT CONTRAST TECHNIQUE: Contiguous axial images were obtained from the base of the skull through the vertex without intravenous contrast. COMPARISON:  09/20/2016 MRI head. FINDINGS: Brain: No evidence of acute infarction, hemorrhage, hydrocephalus, extra-axial collection or mass lesion/mass effect. Vascular: No hyperdense vessel or unexpected calcification. Skull: Normal. Negative for fracture or focal lesion. Sinuses/Orbits: No acute finding. Other: None. IMPRESSION: Normal head CT. Electronically Signed   By: Mitzi Hansen M.D.   On: 07/05/2018 22:07    Procedures Procedures (including critical care time)  Medications Ordered in ED Medications - No data to display   Initial Impression / Assessment and Plan / ED Course  I have reviewed the triage vital signs and the nursing notes.  Pertinent labs & imaging results that were available during my care of the patient were reviewed by me and considered in my medical decision  making (see chart for details).     Patient presents to the emergency department for evaluation of headache and blurry vision that occurred suddenly after lifting weights.  Patient symptoms have slowly improved and he is now back to his baseline.  He has a normal neurologic evaluation.  Head CT is normal.  All blood work is unremarkable.  Patient is an otherwise healthy individual who is back to his baseline, does not require any further work-up.  Final Clinical Impressions(s) / ED Diagnoses   Final diagnoses:  Bad headache    ED Discharge Orders    None       Gilda Crease, MD 07/05/18 2356

## 2018-07-05 NOTE — ED Triage Notes (Signed)
Reports bad headache that started while lifting weights this evening in the gym.  Also endorses some blurred vision. No neuro deficits noted.

## 2018-11-11 DIAGNOSIS — Z31440 Encounter of male for testing for genetic disease carrier status for procreative management: Secondary | ICD-10-CM | POA: Diagnosis not present

## 2019-02-04 ENCOUNTER — Ambulatory Visit: Payer: BLUE CROSS/BLUE SHIELD | Admitting: Emergency Medicine

## 2019-02-04 DIAGNOSIS — Z1329 Encounter for screening for other suspected endocrine disorder: Secondary | ICD-10-CM | POA: Diagnosis not present

## 2019-02-04 DIAGNOSIS — E782 Mixed hyperlipidemia: Secondary | ICD-10-CM | POA: Diagnosis not present

## 2019-02-04 DIAGNOSIS — Z136 Encounter for screening for cardiovascular disorders: Secondary | ICD-10-CM | POA: Diagnosis not present

## 2019-02-04 DIAGNOSIS — Z01118 Encounter for examination of ears and hearing with other abnormal findings: Secondary | ICD-10-CM | POA: Diagnosis not present

## 2019-02-04 DIAGNOSIS — Z131 Encounter for screening for diabetes mellitus: Secondary | ICD-10-CM | POA: Diagnosis not present

## 2019-02-04 DIAGNOSIS — Z01021 Encounter for examination of eyes and vision following failed vision screening with abnormal findings: Secondary | ICD-10-CM | POA: Diagnosis not present

## 2019-02-04 DIAGNOSIS — Z0001 Encounter for general adult medical examination with abnormal findings: Secondary | ICD-10-CM | POA: Diagnosis not present

## 2019-02-04 DIAGNOSIS — R0789 Other chest pain: Secondary | ICD-10-CM | POA: Diagnosis not present

## 2019-02-11 ENCOUNTER — Other Ambulatory Visit: Payer: Self-pay

## 2019-02-11 ENCOUNTER — Ambulatory Visit: Payer: BC Managed Care – PPO | Admitting: Emergency Medicine

## 2019-02-11 ENCOUNTER — Encounter: Payer: Self-pay | Admitting: Emergency Medicine

## 2019-02-11 VITALS — BP 115/77 | HR 75 | Temp 98.9°F | Resp 16 | Ht 71.0 in | Wt 167.8 lb

## 2019-02-11 DIAGNOSIS — R079 Chest pain, unspecified: Secondary | ICD-10-CM | POA: Diagnosis not present

## 2019-02-11 DIAGNOSIS — Z23 Encounter for immunization: Secondary | ICD-10-CM | POA: Diagnosis not present

## 2019-02-11 DIAGNOSIS — K2100 Gastro-esophageal reflux disease with esophagitis, without bleeding: Secondary | ICD-10-CM | POA: Insufficient documentation

## 2019-02-11 MED ORDER — ESOMEPRAZOLE MAGNESIUM 40 MG PO CPDR: 40.0000 mg | DELAYED_RELEASE_CAPSULE | Freq: Every day | ORAL | 3 refills | Status: DC

## 2019-02-11 MED ORDER — FAMOTIDINE 40 MG PO TABS: 40.0000 mg | ORAL_TABLET | Freq: Every day | ORAL | 0 refills | Status: DC

## 2019-02-11 NOTE — Progress Notes (Signed)
Allen [name] 38 y.o.   Chief Complaint  Patient presents with  . Chest Pain    x 1 week - per patient feels like somethin in the throat, it start night  . Shoulder Pain    x 3 days LEFT shoulder blade area    HISTORY OF PRESENT ILLNESS: This is a 38 y.o. male complaining of burning chest pain for the past week along with burning throat.  No other significant symptoms.  Non-smoker.  Regular EtOH consumer.  Denies nausea or vomiting.  Occasional diarrhea.  Denies flulike symptoms, fever or chills.  Denies melena or rectal bleeding.  Pain is intermittent with some radiation from epigastric area up into his throat.  Denies any other significant symptoms.  Healthy man.  No chronic medical problems.  No significant cardiac risk factors.  HPI   Prior to Admission medications   Medication Sig Start Date End Date Taking? Authorizing Provider  Multiple Vitamin (MULTIVITAMIN) tablet Take 1 tablet by mouth as needed.   Yes [provider]  diazepam (VALIUM) 5 MG tablet Take 1 tablet (5 mg total) by mouth 2 (two) times daily. Patient not taking: Reported on 02/11/2019 06/06/13   Noe Gens, PA-C  ibuprofen (ADVIL,MOTRIN) 600 MG tablet Take 1 tablet (600 mg total) by mouth every 6 (six) hours as needed for pain. Patient not taking: Reported on 02/11/2019 06/06/13   Noe Gens, PA-C    No Known Allergies  There are no active problems to display for this patient.   History reviewed. No pertinent past medical history.  History reviewed. No pertinent surgical history.  Social History   Socioeconomic History  . Marital status: Married    Spouse name: Not on file  . Number of children: Not on file  . Years of education: Not on file  . Highest education level: Not on file  Occupational History  . Not on file  Social Needs  . Financial resource strain: Not on file  . Food insecurity:    Worry: Not on file    Inability: Not on file  . Transportation needs:    Medical: Not on  file    Non-medical: Not on file  Tobacco Use  . Smoking status: Never Smoker  . Smokeless tobacco: Never Used  Substance and Sexual Activity  . Alcohol use: Yes    Comment: Occassionlly   . Drug use: No  . Sexual activity: Not on file  Lifestyle  . Physical activity:    Days per week: Not on file    Minutes per session: Not on file  . Stress: Not on file  Relationships  . Social connections:    Talks on phone: Not on file    Gets together: Not on file    Attends religious service: Not on file    Active member of club or organization: Not on file    Attends meetings of clubs or organizations: Not on file    Relationship status: Not on file  . Intimate partner violence:    Fear of current or ex partner: Not on file    Emotionally abused: Not on file    Physically abused: Not on file    Forced sexual activity: Not on file  Other Topics Concern  . Not on file  Social History Narrative  . Not on file    History reviewed. No pertinent family history.   Review of Systems  Constitutional: Negative.  Negative for chills and fever.  HENT: Positive for  sore throat. Negative for congestion and sinus pain.   Eyes: Negative.  Negative for blurred vision and double vision.  Respiratory: Negative.  Negative for cough and shortness of breath.   Cardiovascular: Positive for chest pain. Negative for palpitations.  Gastrointestinal: Positive for abdominal pain, diarrhea and heartburn. Negative for blood in stool, melena, nausea and vomiting.  Genitourinary: Negative for dysuria and hematuria.  Musculoskeletal: Negative for back pain and myalgias.  Skin: Negative.   Neurological: Negative for dizziness, focal weakness, seizures, loss of consciousness and headaches.  Endo/Heme/Allergies: Negative.   All other systems reviewed and are negative.   Vitals:   02/11/19 0918  BP: 115/77  Pulse: 75  Resp: 16  Temp: 98.9 F (37.2 C)  SpO2: 99%    Physical Exam Vitals signs reviewed.   Constitutional:      Appearance: He is well-developed.  HENT:     Head: Normocephalic and atraumatic.     Nose: Nose normal.     Mouth/Throat:     Mouth: Mucous membranes are moist.     Pharynx: Oropharynx is clear. Posterior oropharyngeal erythema present. No oropharyngeal exudate.  Eyes:     Extraocular Movements: Extraocular movements intact.     Conjunctiva/sclera: Conjunctivae normal.     Pupils: Pupils are equal, round, and reactive to light.  Neck:     Musculoskeletal: Normal range of motion and neck supple.  Cardiovascular:     Rate and Rhythm: Normal rate and regular rhythm.     Heart sounds: Normal heart sounds.  Pulmonary:     Effort: Pulmonary effort is normal.     Breath sounds: Normal breath sounds.  Abdominal:     General: Bowel sounds are normal. There is no distension.     Palpations: Abdomen is soft. There is no mass.     Tenderness: There is no abdominal tenderness.     Hernia: No hernia is present.  Musculoskeletal: Normal range of motion.  Skin:    General: Skin is warm and dry.     Capillary Refill: Capillary refill takes less than 2 seconds.  Neurological:     General: No focal deficit present.     Mental Status: He is alert and oriented to person, place, and time.  Psychiatric:        Mood and Affect: Mood normal.        Behavior: Behavior normal.    EKG: Normal sinus rhythm with ventricular rate of 66.  Normal intervals.  No acute ischemic changes.  Normal EKG.  ASSESSMENT & PLAN: Allen [name] was seen today for chest pain and shoulder pain.  Diagnoses and all orders for this visit:  Chest pain, unspecified type -     EKG 12-Lead  Need for diphtheria-tetanus-pertussis (Tdap) vaccine -     Tdap vaccine greater than or equal to 7yo IM  Gastroesophageal reflux disease with esophagitis -     esomeprazole (NEXIUM) 40 MG capsule; Take 1 capsule (40 mg total) by mouth daily. -     famotidine (PEPCID) 40 MG tablet; Take 1 tablet (40 mg total) by mouth  daily for 10 days.    Patient Instructions       If you have lab work done today you will be contacted with your lab results within the next 2 weeks.  If you have not heard from us then please contact us. The fastest way to get your results is to register for My Chart.   IF you received an x-ray today, you will  receive an Econom[facility]voice from [facility]. Please contact [facility] at [phone] with questions or concerns regarding your invoice.   IF you received labwork today, you will receive an invoice from [facility]. Please contact [facility] at [phone] with questions or concerns regarding your invoice.   Our billing staff will not be able to assist you with questions regarding bills from these companies.  You will be contacted with the lab results as soon as they are available. The fastest way to get your results is to activate your My Chart account. Instructions are located on the last page of this paperwork. If you have not heard from us regarding the results in 2 weeks, please contact this office.     Gastroesophageal Reflux Disease, Adult Gastroesophageal reflux (GER) happens when acid from the stomach flows up into the tube that connects the mouth and the stomach (esophagus). Normally, food travels down the esophagus and stays in the stomach to be digested. With GER, food and stomach acid sometimes move back up into the esophagus. You may have a disease called gastroesophageal reflux disease (GERD) if the reflux:  Happens often.  Causes frequent or very bad symptoms.  Causes problems such as damage to the esophagus. When this happens, the esophagus becomes sore and swollen (inflamed). Over time, GERD can make small holes (ulcers) in the lining of the esophagus. What are the causes? This condition is caused by a problem with the muscle between the esophagus and the stomach. When this muscle is weak or not normal, it does not close properly to keep food and acid  from coming back up from the stomach. The muscle can be weak because of:  Tobacco use.  Pregnancy.  Having a certain type of hernia (hiatal hernia).  Alcohol use.  Certain foods and drinks, such as coffee, chocolate, onions, and peppermint. What increases the risk? You are more likely to develop this condition if you:  Are overweight.  Have a disease that affects your connective tissue.  Use NSAID medicines. What are the signs or symptoms? Symptoms of this condition include:  Heartburn.  Difficult or painful swallowing.  The feeling of having a lump in the throat.  A bitter taste in the mouth.  Bad breath.  Having a lot of saliva.  Having an upset or bloated stomach.  Belching.  Chest pain. Different conditions can cause chest pain. Make sure you see your doctor if you have chest pain.  Shortness of breath or noisy breathing (wheezing).  Ongoing (chronic) cough or a cough at night.  Wearing away of the surface of teeth (tooth enamel).  Weight loss. How is this treated? Treatment will depend on how bad your symptoms are. Your doctor may suggest:  Changes to your diet.  Medicine.  Surgery. Follow these instructions at home: Eating and drinking   Follow a diet as told by your doctor. You may need to avoid foods and drinks such as: ? Coffee and tea (with or without caffeine). ? Drinks that contain alcohol. ? Energy drinks and sports drinks. ? Bubbly (carbonated) drinks or sodas. ? Chocolate and cocoa. ? Peppermint and mint flavorings. ? Garlic and onions. ? Horseradish. ? Spicy and acidic foods. These include peppers, chili powder, curry powder, vinegar, hot sauces, and BBQ sauce. ? Citrus fruit juices and citrus fruits, such as oranges, lemons, and limes. ? Tomato-based foods. These include red sauce, chili, salsa, and pizza with red sauce. ? Fried and fatty foods. These include donuts, french fries, potato chips, and  high-fat dressings. ? High-fat  meats. These include hot dogs, rib eye steak, sausage, ham, and bacon. ? High-fat dairy items, such as whole milk, butter, and cream cheese.  Eat small meals often. Avoid eating large meals.  Avoid drinking large amounts of liquid with your meals.  Avoid eating meals during the 2-3 hours before bedtime.  Avoid lying down right after you eat.  Do not exercise right after you eat. Lifestyle   Do not use any products that contain nicotine or tobacco. These include cigarettes, e-cigarettes, and chewing tobacco. If you need help quitting, ask your doctor.  Try to lower your stress. If you need help doing this, ask your doctor.  If you are overweight, lose an amount of weight that is healthy for you. Ask your doctor about a safe weight loss goal. General instructions  Pay attention to any changes in your symptoms.  Take over-the-counter and prescription medicines only as told by your doctor. Do not take aspirin, ibuprofen, or other NSAIDs unless your doctor says it is okay.  Wear loose clothes. Do not wear anything tight around your waist.  Raise (elevate) the head of your bed about 6 inches (15 cm).  Avoid bending over if this makes your symptoms worse.  Keep all follow-up visits as told by your doctor. This is important. Contact a doctor if:  You have new symptoms.  You lose weight and you do not know why.  You have trouble swallowing or it hurts to swallow.  You have wheezing or a cough that keeps happening.  Your symptoms do not get better with treatment.  You have a hoarse voice. Get help right away if:  You have pain in your arms, neck, jaw, teeth, or back.  You feel sweaty, dizzy, or light-headed.  You have chest pain or shortness of breath.  You throw up (vomit) and your throw-up looks like blood or coffee grounds.  You pass out (faint).  Your poop (stool) is bloody or black.  You cannot swallow, drink, or eat. Summary  If a person has gastroesophageal  reflux disease (GERD), food and stomach acid move back up into the esophagus and cause symptoms or problems such as damage to the esophagus.  Treatment will depend on how bad your symptoms are.  Follow a diet as told by your doctor.  Take all medicines only as told by your doctor. This information is not intended to replace advice given to you by your health care provider. Make sure you discuss any questions you have with your health care provider. Document Released: 02/07/2008 Document Revised: 02/27/2018 Document Reviewed: 02/27/2018 Elsevier Interactive Patient Education  2019 [employer]      Edwina BarthMiguel Acadia Thammavong, MD [facility]

## 2019-02-11 NOTE — Patient Instructions (Addendum)
   If you have lab work done today you will be contacted with your lab results within the next 2 weeks.  If you have not heard from us then please contact us. The fastest way to get your results is to register for My Chart.   IF you received an x-ray today, you will receive an invoice from [facility]. Please contact [facility] at [phone] with questions or concerns regarding your invoice.   IF you received labwork today, you will receive an invoice from [facility]. Please contact [facility] at [phone] with questions or concerns regarding your invoice.   Our billing staff will not be able to assist you with questions regarding bills from these companies.  You will be contacted with the lab results as soon as they are available. The fastest way to get your results is to activate your My Chart account. Instructions are located on the last page of this paperwork. If you have not heard from us regarding the results in 2 weeks, please contact this office.     Gastroesophageal Reflux Disease, Adult Gastroesophageal reflux (GER) happens when acid from the stomach flows up into the tube that connects the mouth and the stomach (esophagus). Normally, food travels down the esophagus and stays in the stomach to be digested. With GER, food and stomach acid sometimes move back up into the esophagus. You may have a disease called gastroesophageal reflux disease (GERD) if the reflux:  Happens often.  Causes frequent or very bad symptoms.  Causes problems such as damage to the esophagus. When this happens, the esophagus becomes sore and swollen (inflamed). Over time, GERD can make small holes (ulcers) in the lining of the esophagus. What are the causes? This condition is caused by a problem with the muscle between the esophagus and the stomach. When this muscle is weak or not normal, it does not close properly to keep food and acid from coming back up from the stomach. The muscle  can be weak because of:  Tobacco use.  Pregnancy.  Having a certain type of hernia (hiatal hernia).  Alcohol use.  Certain foods and drinks, such as coffee, chocolate, onions, and peppermint. What increases the risk? You are more likely to develop this condition if you:  Are overweight.  Have a disease that affects your connective tissue.  Use NSAID medicines. What are the signs or symptoms? Symptoms of this condition include:  Heartburn.  Difficult or painful swallowing.  The feeling of having a lump in the throat.  A bitter taste in the mouth.  Bad breath.  Having a lot of saliva.  Having an upset or bloated stomach.  Belching.  Chest pain. Different conditions can cause chest pain. Make sure you see your doctor if you have chest pain.  Shortness of breath or noisy breathing (wheezing).  Ongoing (chronic) cough or a cough at night.  Wearing away of the surface of teeth (tooth enamel).  Weight loss. How is this treated? Treatment will depend on how bad your symptoms are. Your doctor may suggest:  Changes to your diet.  Medicine.  Surgery. Follow these instructions at home: Eating and drinking   Follow a diet as told by your doctor. You may need to avoid foods and drinks such as: ? Coffee and tea (with or without caffeine). ? Drinks that contain alcohol. ? Energy drinks and sports drinks. ? Bubbly (carbonated) drinks or sodas. ? Chocolate and cocoa. ? Peppermint and mint flavorings. ? Garlic and onions. ? Horseradish. ?   Spicy and acidic foods. These include peppers, chili powder, curry powder, vinegar, hot sauces, and BBQ sauce. ? Citrus fruit juices and citrus fruits, such as oranges, lemons, and limes. ? Tomato-based foods. These include red sauce, chili, salsa, and pizza with red sauce. ? Fried and fatty foods. These include donuts, french fries, potato chips, and high-fat dressings. ? High-fat meats. These include hot dogs, rib eye steak,  sausage, ham, and bacon. ? High-fat dairy items, such as whole milk, butter, and cream cheese.  Eat small meals often. Avoid eating large meals.  Avoid drinking large amounts of liquid with your meals.  Avoid eating meals during the 2-3 hours before bedtime.  Avoid lying down right after you eat.  Do not exercise right after you eat. Lifestyle   Do not use any products that contain nicotine or tobacco. These include cigarettes, e-cigarettes, and chewing tobacco. If you need help quitting, ask your doctor.  Try to lower your stress. If you need help doing this, ask your doctor.  If you are overweight, lose an amount of weight that is healthy for you. Ask your doctor about a safe weight loss goal. General instructions  Pay attention to any changes in your symptoms.  Take over-the-counter and prescription medicines only as told by your doctor. Do not take aspirin, ibuprofen, or other NSAIDs unless your doctor says it is okay.  Wear loose clothes. Do not wear anything tight around your waist.  Raise (elevate) the head of your bed about 6 inches (15 cm).  Avoid bending over if this makes your symptoms worse.  Keep all follow-up visits as told by your doctor. This is important. Contact a doctor if:  You have new symptoms.  You lose weight and you do not know why.  You have trouble swallowing or it hurts to swallow.  You have wheezing or a cough that keeps happening.  Your symptoms do not get better with treatment.  You have a hoarse voice. Get help right away if:  You have pain in your arms, neck, jaw, teeth, or back.  You feel sweaty, dizzy, or light-headed.  You have chest pain or shortness of breath.  You throw up (vomit) and your throw-up looks like blood or coffee grounds.  You pass out (faint).  Your poop (stool) is bloody or black.  You cannot swallow, drink, or eat. Summary  If a person has gastroesophageal reflux disease (GERD), food and stomach acid  move back up into the esophagus and cause symptoms or problems such as damage to the esophagus.  Treatment will depend on how bad your symptoms are.  Follow a diet as told by your doctor.  Take all medicines only as told by your doctor. This information is not intended to replace advice given to you by your health care provider. Make sure you discuss any questions you have with your health care provider. Document Released: 02/07/2008 Document Revised: 02/27/2018 Document Reviewed: 02/27/2018 Elsevier Interactive Patient Education  2019 [employer]  

## 2019-03-11 ENCOUNTER — Ambulatory Visit: Payer: Self-pay | Admitting: *Deleted

## 2019-03-11 NOTE — Telephone Encounter (Signed)
Pt called with c/o continuing chest and back pain. He denies nausea, sweating, dizziness or shortness of breath. Has been going on for the last couple of days and he saw his provider in June regarding this pain. He has taken the prescribe medication but feels like it is not helping. He does not have a cardiac history per pt. Or lung disease. Advised of having a virtual appointment, with verbal understanding. Primary Care at [facility] notified regarding an appointment. Call transferred to the practice. Routing triage to the office for review.   Reason for Disposition . [1] Chest pain lasts > 5 minutes AND [2] occurred > 3 days ago (72 hours) AND [3] NO chest pain or cardiac symptoms now  Answer Assessment - Initial Assessment Questions 1. LOCATION: "Where does it hurt?"        Chest area and back 2. RADIATION: "Does the pain go anywhere else?" (e.g., into neck, jaw, arms, back)     back 3. ONSET: "When did the chest pain begin?" (Minutes, hours or days)      A couple of days ago 4. PATTERN "Does the pain come and go, or has it been constant since it started?"  "Does it get worse with exertion?"      Comes and goes 5. DURATION: "How long does it last" (e.g., seconds, minutes, hours)     ? 6. SEVERITY: "How bad is the pain?"  (e.g., Scale 1-10; mild, moderate, or severe)    - MILD (1-3): doesn't interfere with normal activities     - MODERATE (4-7): interferes with normal activities or awakens from sleep    - SEVERE (8-10): excruciating pain, unable to do any normal activities       Pain # 5  7. CARDIAC RISK FACTORS: "Do you have any history of heart problems or risk factors for heart disease?" (e.g., prior heart attack, angina; high blood pressure, diabetes, being overweight, high cholesterol, smoking, or strong family history of heart disease)     no 8. PULMONARY RISK FACTORS: "Do you have any history of lung disease?"  (e.g., blood clots in lung, asthma, emphysema, birth control pills)   no 9. CAUSE: "What do you think is causing the chest pain?"     Not sure 10. OTHER SYMPTOMS: "Do you have any other symptoms?" (e.g., dizziness, nausea, vomiting, sweating, fever, difficulty breathing, cough)       no 11. PREGNANCY: "Is there any chance you are pregnant?" "When was your last menstrual period?"       no  Protocols used: CHEST PAIN-A-AH

## 2019-03-13 ENCOUNTER — Encounter: Payer: Self-pay | Admitting: Cardiology

## 2019-03-13 ENCOUNTER — Encounter: Payer: Self-pay | Admitting: Family Medicine

## 2019-03-13 ENCOUNTER — Ambulatory Visit (INDEPENDENT_AMBULATORY_CARE_PROVIDER_SITE_OTHER): Payer: BC Managed Care – PPO | Admitting: Family Medicine

## 2019-03-13 ENCOUNTER — Other Ambulatory Visit: Payer: Self-pay

## 2019-03-13 VITALS — BP 131/81 | HR 69 | Temp 98.8°F | Ht 70.5 in | Wt 167.0 lb

## 2019-03-13 DIAGNOSIS — R079 Chest pain, unspecified: Secondary | ICD-10-CM | POA: Diagnosis not present

## 2019-03-13 DIAGNOSIS — R195 Other fecal abnormalities: Secondary | ICD-10-CM | POA: Insufficient documentation

## 2019-03-13 DIAGNOSIS — K2100 Gastro-esophageal reflux disease with esophagitis, without bleeding: Secondary | ICD-10-CM

## 2019-03-13 NOTE — Progress Notes (Signed)
Acute Office Visit  Subjective:    Patient ID: Allen [name], male    DOB: 03-Apr-1981, 38 y.o.   MRN: [id number]  Chief Complaint  Patient presents with  . CHEST discomfort    off and on for last week or so    HPI Patient is in today for chest pain -recurrence from 2019. Pt with h/o of stress test and echo normal at [facility]. Pt with nitro prn use-none recently.  Pt states reoccurring pain several weeks ago for the first time since his cardiac work up.  Pt states he did not have mediction for awhile so did not try nitro for pain symptoms. Pt with difficulty swallowing intermittently with squeezing sensation. Pt states he feels like "something is sticking" when he swallows at times. Pt describes pain as squeezing from upper stomach up into throat and toward the back. Nausea, no vomiting.  No headache.  Pt is scheduled to see cardio tomorrow for re-evaluation. Reviewed old records with pt from cardiologist-reports and notes.   PMH-prior w/u for cp -2018 Pt does not know family history  Social History   Socioeconomic History  . Marital status: Married    Spouse name: Not on file  . Number of children: Not on file  . Years of education: Not on file  . Highest education level: Not on file  Occupational History  . Not on file  Social Needs  . Financial resource strain: Not on file  . Food insecurity    Worry: Not on file    Inability: Not on file  . Transportation needs    Medical: Not on file    Non-medical: Not on file  Tobacco Use  . Smoking status: Never Smoker  . Smokeless tobacco: Never Used  Substance and Sexual Activity  . Alcohol use: Yes    Comment: Occassionlly   . Drug use: No  . Sexual activity: Not on file  Lifestyle  . Physical activity    Days per week: Not on file    Minutes per session: Not on file  . Stress: Not on file  Relationships  . Social Herbalist on phone: Not on file    Gets together: Not on file    Attends religious  service: Not on file    Active member of club or organization: Not on file    Attends meetings of clubs or organizations: Not on file    Relationship status: Not on file  . Intimate partner violence    Fear of current or ex partner: Not on file    Emotionally abused: Not on file    Physically abused: Not on file    Forced sexual activity: Not on file  Other Topics Concern  . Not on file  Social History Narrative  . Not on file    Outpatient Medications Prior to Visit  Medication Sig Dispense Refill  . esomeprazole (NEXIUM) 40 MG capsule Take 1 capsule (40 mg total) by mouth daily. 30 capsule 3  . famotidine (PEPCID) 40 MG tablet Take 1 tablet (40 mg total) by mouth daily for 10 days. 10 tablet 0  . Multiple Vitamin (MULTIVITAMIN) tablet Take 1 tablet by mouth as needed.     No facility-administered medications prior to visit.     No Known Allergies  Review of Systems  Constitutional: Negative for chills, fever and malaise/fatigue.  Respiratory: Negative for cough, sputum production and shortness of breath.   Cardiovascular: Positive for chest pain and  leg swelling. Negative for palpitations.  Gastrointestinal: Positive for nausea. Negative for diarrhea.  Genitourinary: Negative for dysuria.  Neurological: Negative for dizziness and headaches.       Objective:    Physical Exam  Constitutional: He is oriented to person, place, and time. He appears well-developed and well-nourished.  HENT:  Head: Normocephalic and atraumatic.  Eyes: Conjunctivae are normal.  Cardiovascular: Normal rate, regular rhythm, normal heart sounds and intact distal pulses.  Pulmonary/Chest: Effort normal and breath sounds normal.  Abdominal: Soft. Bowel sounds are normal.  Neurological: He is alert and oriented to person, place, and time.    BP 131/81 (BP Location: Right Arm, Patient Position: Sitting, Cuff Size: Normal)   Pulse 69   Temp 98.8 F (37.1 C) (Oral)   Ht 5' 10.5" (1.791 m)   Wt  167 lb (75.8 kg)   SpO2 95%   BMI 23.62 kg/m  Wt Readings from Last 3 Encounters:  03/13/19 167 lb (75.8 kg)  02/11/19 167 lb 12.8 oz (76.1 kg)  07/05/18 170 lb (77.1 kg)     Lab Results  Component Value Date   TSH 0.901 01/04/2017   Lab Results  Component Value Date   WBC 6.2 07/05/2018   HGB 15.4 07/05/2018   HCT 46.6 07/05/2018   MCV 86.5 07/05/2018   PLT 186 07/05/2018   Lab Results  Component Value Date   NA 137 07/05/2018   K 4.6 07/05/2018   CO2 29 07/05/2018   GLUCOSE 110 (H) 07/05/2018   BUN 14 07/05/2018   CREATININE 1.13 07/05/2018   BILITOT 0.8 07/05/2018   ALKPHOS 57 07/05/2018   AST 22 07/05/2018   ALT 23 07/05/2018   PROT 7.9 07/05/2018   ALBUMIN 4.4 07/05/2018   CALCIUM 9.6 07/05/2018   ANIONGAP 6 07/05/2018   Lab Results  Component Value Date   CHOL 178 01/04/2017   Lab Results  Component Value Date   HDL 58 01/04/2017   Lab Results  Component Value Date   LDLCALC 99 01/04/2017   Lab Results  Component Value Date   TRIG 104 01/04/2017   Lab Results  Component Value Date   CHOLHDL 3.1 01/04/2017      Assessment & Plan:  1. Gastroesophageal reflux disease with esophagitis Taking omeprazole, and pepcid - Ambulatory referral to Gastroenterology - Ambulatory referral to Cardiology - CBC with Differential - CMP14+EGFR - Amylase - Lipase  2. Change in stool - Ambulatory referral to Gastroenterology - CBC with Differential - CMP14+EGFR - Amylase - Lipase  3. Chest pain, unspecified type - Ambulatory referral to Cardiology-reviewed notes, LE trace edema noted by pt  - CBC with Differential - CMP14+EGFR - Amylase - Lipase LISA Hannah Beat, MD

## 2019-03-13 NOTE — Patient Instructions (Addendum)
     If you have lab work done today you will be contacted with your lab results within the next 2 weeks.  If you have not heard from [place] then please contact us. The fastest way to get your results is to register for My Chart. Keep appt at cardiology tomorrow Will schedule appt with gastroenterology  labwork today  IF you received an x-ray today, you will receive an invoice from [facility]. Please contact [facility] at [phone] with questions or concerns regarding your invoice.   IF you received labwork today, you will receive an invoice from [facility]. Please contact [facility] at [phone] with questions or concerns regarding your invoice.   Our billing staff will not be able to assist you with questions regarding bills from these companies.  You will be contacted with the lab results as soon as they are available. The fastest way to get your results is to activate your My Chart account. Instructions are located on the last page of this paperwork. If you have not heard from [place] regarding the results in 2 weeks, please contact this office.

## 2019-03-14 ENCOUNTER — Ambulatory Visit (INDEPENDENT_AMBULATORY_CARE_PROVIDER_SITE_OTHER): Payer: BC Managed Care – PPO | Admitting: Cardiology

## 2019-03-14 ENCOUNTER — Encounter: Payer: Self-pay | Admitting: Cardiology

## 2019-03-14 VITALS — BP 133/90 | HR 67 | Ht 70.0 in | Wt 166.0 lb

## 2019-03-14 DIAGNOSIS — R0789 Other chest pain: Secondary | ICD-10-CM | POA: Diagnosis not present

## 2019-03-14 DIAGNOSIS — R030 Elevated blood-pressure reading, without diagnosis of hypertension: Secondary | ICD-10-CM | POA: Diagnosis not present

## 2019-03-14 DIAGNOSIS — R9431 Abnormal electrocardiogram [ECG] [EKG]: Secondary | ICD-10-CM | POA: Diagnosis not present

## 2019-03-14 DIAGNOSIS — K219 Gastro-esophageal reflux disease without esophagitis: Secondary | ICD-10-CM

## 2019-03-14 LAB — CMP14+EGFR
ALT: 16 IU/L (ref 0–44)
AST: 20 IU/L (ref 0–40)
Albumin/Globulin Ratio: 1.8 (ref 1.2–2.2)
Albumin: 5.1 g/dL — ABNORMAL HIGH (ref 4.0–5.0)
Alkaline Phosphatase: 64 IU/L (ref 39–117)
BUN/Creatinine Ratio: 14 (ref 9–20)
BUN: 14 mg/dL (ref 6–20)
Bilirubin Total: 0.4 mg/dL (ref 0.0–1.2)
CO2: 22 mmol/L (ref 20–29)
Calcium: 9.6 mg/dL (ref 8.7–10.2)
Chloride: 100 mmol/L (ref 96–106)
Creatinine, Ser: 1.01 mg/dL (ref 0.76–1.27)
GFR calc Af Amer: 111 mL/min/{1.73_m2} (ref >59)
GFR calc non Af Amer: 96 mL/min/{1.73_m2} (ref >59)
Globulin, Total: 2.8 g/dL (ref 1.5–4.5)
Glucose: 86 mg/dL (ref 65–99)
Potassium: 4.1 mmol/L (ref 3.5–5.2)
Sodium: 140 mmol/L (ref 134–144)
Total Protein: 7.9 g/dL (ref 6.0–8.5)

## 2019-03-14 LAB — CBC WITH DIFFERENTIAL/PLATELET
Basophils Absolute: 0 10*3/uL (ref 0.0–0.2)
Basos: 1 %
EOS (ABSOLUTE): 0.3 10*3/uL (ref 0.0–0.4)
Eos: 8 %
Hematocrit: 45.5 % (ref 37.5–51.0)
Hemoglobin: 15.2 g/dL (ref 13.0–17.7)
Immature Grans (Abs): 0 10*3/uL (ref 0.0–0.1)
Immature Granulocytes: 0 %
Lymphocytes Absolute: 2.1 10*3/uL (ref 0.7–3.1)
Lymphs: 47 %
MCH: 29.4 pg (ref 26.6–33.0)
MCHC: 33.4 g/dL (ref 31.5–35.7)
MCV: 88 fL (ref 79–97)
Monocytes Absolute: 0.2 10*3/uL (ref 0.1–0.9)
Monocytes: 5 %
Neutrophils Absolute: 1.7 10*3/uL (ref 1.4–7.0)
Neutrophils: 39 %
Platelets: 185 10*3/uL (ref 150–450)
RBC: 5.17 x10E6/uL (ref 4.14–5.80)
RDW: 13.2 % (ref 11.6–15.4)
WBC: 4.4 10*3/uL (ref 3.4–10.8)

## 2019-03-14 LAB — AMYLASE: Amylase: 90 U/L (ref 31–110)

## 2019-03-14 LAB — LIPASE: Lipase: 26 U/L (ref 13–78)

## 2019-03-14 MED ORDER — NITROGLYCERIN 0.4 MG SL SUBL: 0.4000 mg | SUBLINGUAL_TABLET | SUBLINGUAL | 3 refills | Status: DC | PRN

## 2019-03-14 NOTE — Progress Notes (Signed)
Primary Physician:  Horald Pollen, MD   Patient ID: Allen [name], male    DOB: [birth year], 38 y.o.   MRN: [id number]  Subjective:    Chief Complaint  Patient presents with   Chest Pain   Follow-up    HPI: Zaylan Kissoon  is a 38 y.o. male  with  Hyperlipidemia last seen in 2018 for musculoskeletal chest pain and abnormal EKG, underwent echocardiogram that revealed moderate LVH, normal LVEF. Treadmill stress test was considered low risk study. He now presents for follow up on chest pain.   Chest pain had resolved; however, over the last 2 weeks has had again recurrence of chest pain. He has recently seen PCP who felt chest pain related to GERD, but felt that he should be reevaluated by [place].  Chest pain is described as sharp pain that comes in the left upper part of the chest both at rest and with exertion. Did have one episode of chest pain that radiated through to his back. Symptoms last for a few seconds and if occur with activity, do not make him stop what he is doing. No left arm radiation or associated shortness of breath. Previously lipids were elevated; however, had stopped medication as he felt could control with diet. He had lipids performed yesterday. He was given acid reflux medication and was advised to start this; however, has not been taking. He also has pending referral to GI.  States that he had an episode that awoke him in the middle of the night and felt as though something was stuck in his throat. He states, but nothing was there. He was evaluated by PCP who noted posterior oropharyngeal erythema.   There is no family history of sudden cardiac death or premature coronary artery disease. He is working in the [employer] and is active with this and with push mowing his yard, but does not exercise.Denies dyspnea, palpitations, dizziness or syncope. Has noticed some indention at his sock line on his right leg on occasion. He is a nonsmoker. States that he and his  wife are trying to conceive.   History reviewed. No pertinent past medical history.  History reviewed. No pertinent surgical history.  Social History   Socioeconomic History   Marital status: Married    Spouse name: Not on file   Number of children: 0   Years of education: Not on file   Highest education level: Not on file  Occupational History   Not on file  Social Needs   Financial resource strain: Not on file   Food insecurity    Worry: Not on file    Inability: Not on file   Transportation needs    Medical: Not on file    Non-medical: Not on file  Tobacco Use   Smoking status: Never Smoker   Smokeless tobacco: Never Used  Substance and Sexual Activity   Alcohol use: Yes    Comment: Occassionlly    Drug use: No   Sexual activity: Not on file  Lifestyle   Physical activity    Days per week: Not on file    Minutes per session: Not on file   Stress: Not on file  Relationships   Social connections    Talks on phone: Not on file    Gets together: Not on file    Attends religious service: Not on file    Active member of club or organization: Not on file    Attends meetings of clubs or organizations: Not  on file    Relationship status: Not on file   Intimate partner violence    Fear of current or ex partner: Not on file    Emotionally abused: Not on file    Physically abused: Not on file    Forced sexual activity: Not on file  Other Topics Concern   Not on file  Social History Narrative   Not on file    Review of Systems  Constitution: Negative for decreased appetite, malaise/fatigue, weight gain and weight loss.  Eyes: Negative for visual disturbance.  Cardiovascular: Positive for chest pain and leg swelling. Negative for claudication, dyspnea on exertion, orthopnea, palpitations and syncope.  Respiratory: Negative for hemoptysis and wheezing.   Endocrine: Negative for cold intolerance and heat intolerance.  Hematologic/Lymphatic: Does not  bruise/bleed easily.  Skin: Negative for nail changes.  Musculoskeletal: Negative for muscle weakness and myalgias.  Gastrointestinal: Positive for heartburn. Negative for abdominal pain, change in bowel habit, nausea and vomiting.  Neurological: Negative for difficulty with concentration, dizziness, focal weakness and headaches.  Psychiatric/Behavioral: Negative for altered mental status and suicidal ideas.  All other systems reviewed and are negative.     Objective:  Blood pressure 133/90, pulse 67, height '5\' 10"'  (1.778 m), weight 166 lb (75.3 kg), SpO2 97 %. Body mass index is 23.82 kg/m.    Physical Exam  Constitutional: He is oriented to person, place, and time. Vital signs are normal. He appears well-developed and well-nourished.  HENT:  Head: Normocephalic and atraumatic.  Neck: Normal range of motion.  Cardiovascular: Normal rate, regular rhythm, normal heart sounds and intact distal pulses.  Pulmonary/Chest: Effort normal and breath sounds normal. No accessory muscle usage. No respiratory distress.  Abdominal: Soft. Bowel sounds are normal.  Musculoskeletal: Normal range of motion.  Neurological: He is alert and oriented to person, place, and time.  Skin: Skin is warm and dry.  Vitals reviewed.  Radiology: No results found.  Laboratory examination:    CMP Latest Ref Rng & Units 03/13/2019 07/05/2018 01/04/2017  Glucose 65 - 99 mg/dL 86 110(H) 94  BUN 6 - 20 mg/dL '14 14 16  ' Creatinine 0.76 - 1.27 mg/dL 1.01 1.13 0.99  Sodium 134 - 144 mmol/L 140 137 141  Potassium 3.5 - 5.2 mmol/L 4.1 4.6 4.4  Chloride 96 - 106 mmol/L 100 102 102  CO2 20 - 29 mmol/L '22 29 26  ' Calcium 8.7 - 10.2 mg/dL 9.6 9.6 9.4  Total Protein 6.0 - 8.5 g/dL 7.9 7.9 7.1  Total Bilirubin 0.0 - 1.2 mg/dL 0.4 0.8 0.3  Alkaline Phos 39 - 117 IU/L 64 57 67  AST 0 - 40 IU/L '20 22 20  ' ALT 0 - 44 IU/L '16 23 22   ' CBC Latest Ref Rng & Units 03/13/2019 07/05/2018 01/04/2017  WBC 3.4 - 10.8 x10E3/uL 4.4 6.2 5.1    Hemoglobin 13.0 - 17.7 g/dL 15.2 15.4 15.5  Hematocrit 37.5 - 51.0 % 45.5 46.6 47.8  Platelets 150 - 450 x10E3/uL 185 186 190   Lipid Panel     Component Value Date/Time   CHOL 178 01/04/2017 1223   TRIG 104 01/04/2017 1223   HDL 58 01/04/2017 1223   CHOLHDL 3.1 01/04/2017 1223   LDLCALC 99 01/04/2017 1223   HEMOGLOBIN A1C No results found for: HGBA1C, MPG TSH No results for input(s): TSH in the last 8760 hours.  PRN Meds:. Medications Discontinued During This Encounter  Medication Reason   diazepam (VALIUM) 5 MG tablet    ibuprofen (  ADVIL,MOTRIN) 600 MG tablet    Current Meds  Medication Sig   esomeprazole (NEXIUM) 40 MG capsule Take 1 capsule (40 mg total) by mouth daily. (Patient taking differently: Take 40 mg by mouth as needed. )   famotidine (PEPCID) 40 MG tablet Take 1 tablet (40 mg total) by mouth daily for 10 days. (Patient taking differently: Take 40 mg by mouth daily as needed. )   Multiple Vitamin (MULTIVITAMIN) tablet Take 1 tablet by mouth as needed.    Cardiac Studies:   Treadmill exercise stress test 02/05/2017: Indication: CP, Abn EKG Resting EKG demonstrates NSR. Borderline criteria for LVH. The patient exercised according to Bruce Protocol, Total time recorded 10:11 min achieving max heart rate of 169 which was 90% of THR for age and 12.09 METS of work. Stress terminated due to THR (>85% MPHR)/MPHR met. Normal BP response. There was 2 mm upsloping ST depression at peak exercise which normalized immediately in recovery. The test is negative for ischemia. There were no significant arrhythmias. Normal BP response. Rec: No e/o ischemia by GXT. Exercise tolerence is excellent. Continue Preventive therapy.  Echocardiogram 02/15/2017: Left ventricle cavity is normal in size. Moderate concentric hypertrophy of the left ventricle. Normal global wall motion. Normal diastolic filling pattern, normal LAP. Calculated EF 66%. No significant valvular  abnormalities.  Assessment:     ICD-10-CM   1. Atypical chest pain  R07.89 EKG 12-Lead    PCV ECHOCARDIOGRAM COMPLETE  2. GERD without esophagitis  K21.9   3. Abnormal EKG  R94.31   4. Elevated blood pressure reading in office without diagnosis of hypertension  R03.0     EKG 03/14/2019: Normal sinus rhythm at 62 bpm, left atrial enlargement, normal axis, T wave abnormality in inferior leads, LVH. Normal QT interval. No changes compared to EKG in 2018.   Recommendations:   Patient was seen in 2018 presents for evaluation of chest pain for the last 2 weeks.  Symptoms are atypical as they can occur both at rest and with exertion.  He did have one episode that radiated through to his back, but did not stop him from doing any activity.  He is without history of hyperlipidemia, diabetes, or hypertension although blood pressure is elevated today.  No family history of heart disease or former tobacco use.  He is fairly low risk for CAD.  Although has abnormal EKG that is unchanged compared to 2018, had negative stress test at that time as well.  Will obtain echocardiogram for further evaluation of chest pain.  He did have moderate LVH on previous echocardiogram.    I have advised him to start acid reflux medication daily for the next few weeks to see if his symptoms will improve.  He is also being referred to GI for evaluation.  If he continues to have symptoms and depending upon his echocardiogram results, will proceed with stress testing.  Continue to feel that his chest pain is likely related to GERD or possibly labile hypertension. I have sent him nitroglycerin if he should have exertional chest pain that doesn't resolve with resting. Will reevaluate his blood pressure at his next office visit.  I will see him back in 6 weeks for follow-up.  Miquel Dunn, MSN, APRN, FNP-C [facility]. [place] Office: [phone] Fax: [phone]

## 2019-03-16 ENCOUNTER — Encounter: Payer: Self-pay | Admitting: Cardiology

## 2019-03-17 ENCOUNTER — Other Ambulatory Visit: Payer: Self-pay

## 2019-03-17 ENCOUNTER — Ambulatory Visit (INDEPENDENT_AMBULATORY_CARE_PROVIDER_SITE_OTHER): Payer: BC Managed Care – PPO

## 2019-03-17 DIAGNOSIS — R0789 Other chest pain: Secondary | ICD-10-CM

## 2019-03-19 NOTE — Progress Notes (Signed)
Pt aware.

## 2019-04-09 ENCOUNTER — Ambulatory Visit: Payer: BLUE CROSS/BLUE SHIELD | Admitting: Gastroenterology

## 2019-04-23 ENCOUNTER — Ambulatory Visit: Payer: BC Managed Care – PPO | Admitting: Cardiology

## 2019-04-29 ENCOUNTER — Ambulatory Visit: Payer: BC Managed Care – PPO | Admitting: Cardiology

## 2019-04-30 ENCOUNTER — Ambulatory Visit (INDEPENDENT_AMBULATORY_CARE_PROVIDER_SITE_OTHER): Payer: BC Managed Care – PPO

## 2019-04-30 ENCOUNTER — Other Ambulatory Visit: Payer: Self-pay

## 2019-04-30 ENCOUNTER — Encounter: Payer: Self-pay | Admitting: Emergency Medicine

## 2019-04-30 ENCOUNTER — Ambulatory Visit: Payer: BC Managed Care – PPO | Admitting: Emergency Medicine

## 2019-04-30 VITALS — BP 132/78 | HR 80 | Temp 99.1°F | Ht 71.0 in | Wt 168.0 lb

## 2019-04-30 DIAGNOSIS — R079 Chest pain, unspecified: Secondary | ICD-10-CM | POA: Diagnosis not present

## 2019-04-30 DIAGNOSIS — M778 Other enthesopathies, not elsewhere classified: Secondary | ICD-10-CM

## 2019-04-30 DIAGNOSIS — M940 Chondrocostal junction syndrome [Tietze]: Secondary | ICD-10-CM | POA: Diagnosis not present

## 2019-04-30 DIAGNOSIS — M779 Enthesopathy, unspecified: Secondary | ICD-10-CM

## 2019-04-30 MED ORDER — MELOXICAM 15 MG PO TABS: 15.0000 mg | ORAL_TABLET | Freq: Every day | ORAL | 1 refills | Status: AC

## 2019-04-30 NOTE — Patient Instructions (Addendum)
If you have lab work done today you will be contacted with your lab results within the next 2 weeks.  If you have not heard from [place] then please contact us. The fastest way to get your results is to register for My Chart.   IF you received an x-ray today, you will receive an invoice from [facility]. Please contact [facility] at [phone] with questions or concerns regarding your invoice.   IF you received labwork today, you will receive an invoice from [place]. Please contact [facility] at [phone] with questions or concerns regarding your invoice.   Our billing staff will not be able to assist you with questions regarding bills from these companies.  You will be contacted with the lab results as soon as they are available. The fastest way to get your results is to activate your My Chart account. Instructions are located on the last page of this paperwork. If you have not heard from [place] regarding the results in 2 weeks, please contact this office.      Tendinitis  Tendinitis is inflammation of a tendon. A tendon is a strong cord of tissue that connects muscle to bone. Tendinitis can affect any tendon, but it most commonly affects the:  Shoulder tendon (rotator cuff).  Ankle tendon (Achilles tendon).  Elbow tendon (triceps tendon).  Tendons in the wrist. What are the causes? This condition may be caused by:  Overusing a tendon or muscle. This is common.  Age-related wear and tear.  Injury.  Inflammatory conditions, such as arthritis.  Certain medicines. What increases the risk? You are more likely to develop this condition if you do activities that involve the same movements over and over again (repetitive motions). What are the signs or symptoms? Symptoms of this condition may include:  Pain.  Tenderness.  Mild swelling.  Decreased range of motion. How is this diagnosed? This condition is diagnosed with a physical exam. You may also  have tests, such as:  Ultrasound. This uses sound waves to make an image of the inside of your body in the affected area.  MRI. How is this treated? This condition may be treated by resting, icing, applying pressure (compression), and raising (elevating) the affected area above the level of your heart. This is known as RICE therapy. Treatment may also include:  Medicines to help reduce inflammation or to help reduce pain.  Exercises or physical therapy to strengthen and stretch the tendon.  A brace or splint.  Surgery. This is rarely needed. Follow these instructions at home: If you have a splint or brace:  Wear the splint or brace as told by your health care provider. Remove it only as told by your health care provider.  Loosen the splint or brace if your fingers or toes tingle, become numb, or turn cold and blue.  Keep the splint or brace clean.  If the splint or brace is not waterproof: ? Do not let it get wet. ? Cover it with a watertight covering when you take a bath or shower. Managing pain, stiffness, and swelling  If directed, put ice on the affected area. ? If you have a removable splint or brace, remove it as told by your health care provider. ? Put ice in a plastic bag. ? Place a towel between your skin and the bag. ? Leave the ice on for 20 minutes, 2-3 times a day.  Move the fingers or toes of the affected limb often, if this applies. This  can help to prevent stiffness and lessen swelling.  If directed, raise (elevate) the affected area above the level of your heart while you are sitting or lying down.  If directed, apply heat to the affected area before you exercise. Use the heat source that your health care provider recommends, such as a moist heat pack or a heating pad.     ? Place a towel between your skin and the heat source. ? Leave the heat on for 20-30 minutes. ? Remove the heat if your skin turns bright red. This is especially important if you are  unable to feel pain, heat, or cold. You may have a greater risk of getting burned. Driving  Do not drive or use heavy machinery while taking prescription pain medicine.  Ask your health care provider when it is safe to drive if you have a splint or brace on any part of your arm or leg. Activity  Rest the affected area as told by your health care provider.  Return to your normal activities as told by your health care provider. Ask your health care provider what activities are safe for you.  Avoid using the affected area while you are experiencing symptoms of tendinitis.  Do exercises as told by your health care provider. General instructions  If you have a splint, do not put pressure on any part of the splint until it is fully hardened. This may take several hours.  Wear an elastic bandage or compression wrap only as told by your health care provider.  Take over-the-counter and prescription medicines only as told by your health care provider.  Keep all follow-up visits as told by your health care provider. This is important. Contact a health care provider if:  Your symptoms do not improve.  You develop new, unexplained problems, such as numbness in your hands. Summary  Tendinitis is inflammation of a tendon.  You are more likely to develop this condition if you do activities that involve the same movements over and over again.  This condition may be treated by resting, icing, applying pressure (compression), and elevating the area above the level of your heart. This is known as RICE therapy.  Avoid using the affected area while you are experiencing symptoms of tendinitis. This information is not intended to replace advice given to you by your health care provider. Make sure you discuss any questions you have with your health care provider. Document Released: 08/18/2000 Document Revised: 02/26/2018 Document Reviewed: 01/09/2018 Elsevier Patient Education  2020 [employer]  Costochondritis Costochondritis is swelling and irritation (inflammation) of the tissue (cartilage) that connects your ribs to your breastbone (sternum). This causes pain in the front of your chest. Usually, the pain:  Starts gradually.  Is in more than one rib. This condition usually goes away on its own over time. Follow these instructions at home:  Do not do anything that makes your pain worse.  If directed, put ice on the painful area: ? Put ice in a plastic bag. ? Place a towel between your skin and the bag. ? Leave the ice on for 20 minutes, 2-3 times a day.  If directed, put heat on the affected area as often as told by your doctor. Use the heat source that your doctor tells you to use, such as a moist heat pack or a heating pad. ? Place a towel between your skin and the heat source. ? Leave the heat on for 20-30 minutes. ? Take off the heat if  your skin turns bright red. This is very important if you cannot feel pain, heat, or cold. You may have a greater risk of getting burned.  Take over-the-counter and prescription medicines only as told by your doctor.  Return to your normal activities as told by your doctor. Ask your doctor what activities are safe for you.  Keep all follow-up visits as told by your doctor. This is important. Contact a doctor if:  You have chills or a fever.  Your pain does not go away or it gets worse.  You have a cough that does not go away. Get help right away if:  You are short of breath. This information is not intended to replace advice given to you by your health care provider. Make sure you discuss any questions you have with your health care provider. Document Released: 02/07/2008 Document Revised: 09/05/2017 Document Reviewed: 12/15/2015 Elsevier Patient Education  2020 [employer].

## 2019-04-30 NOTE — Progress Notes (Signed)
Allen [name] 38 y.o.   Chief Complaint  Patient presents with   Sharp pain arm    left arm. started a week now and now it seems like it is worse.     HISTORY OF PRESENT ILLNESS: This is a 38 y.o. male complaining of left arm sharp pain that started 1 week ago.  Denies injury.  No heavy lifting.  Works at the [employer].  Patient also has some localized sharp steady pain to the left upper sternal border.  No cardiac history.  Recently saw the cardiologist and had a stress test done.  Negative results.  Last time he was here last month was diagnosed with possible GERD with esophagitis.  No other significant symptoms.  HPI   Prior to Admission medications   Medication Sig Start Date End Date Taking? Authorizing Provider  Multiple Vitamin (MULTIVITAMIN) tablet Take 1 tablet by mouth as needed.   Yes [provider]    No Known Allergies  Patient Active Problem List   Diagnosis Date Noted   Gastroesophageal reflux disease with esophagitis 02/11/2019    History reviewed. No pertinent past medical history.  History reviewed. No pertinent surgical history.  Social History   Socioeconomic History   Marital status: Married    Spouse name: Not on file   Number of children: 0   Years of education: Not on file   Highest education level: Not on file  Occupational History   Not on file  Social Needs   Financial resource strain: Not on file   Food insecurity    Worry: Not on file    Inability: Not on file   Transportation needs    Medical: Not on file    Non-medical: Not on file  Tobacco Use   Smoking status: Never Smoker   Smokeless tobacco: Never Used  Substance and Sexual Activity   Alcohol use: Yes    Comment: Occassionlly    Drug use: No   Sexual activity: Not on file  Lifestyle   Physical activity    Days per week: Not on file    Minutes per session: Not on file   Stress: Not on file  Relationships   Social  connections    Talks on phone: Not on file    Gets together: Not on file    Attends religious service: Not on file    Active member of club or organization: Not on file    Attends meetings of clubs or organizations: Not on file    Relationship status: Not on file   Intimate partner violence    Fear of current or ex partner: Not on file    Emotionally abused: Not on file    Physically abused: Not on file    Forced sexual activity: Not on file  Other Topics Concern   Not on file  Social History Narrative   Not on file    Family History  Problem Relation Age of Onset   Diabetes Mother    Diabetes Father      Review of Systems  Constitutional: Negative.  Negative for chills and fever.  HENT: Negative.  Negative for congestion and sore throat.   Respiratory: Negative.  Negative for cough and shortness of breath.   Cardiovascular: Positive for chest pain. Negative for palpitations and leg swelling.  Gastrointestinal: Negative.  Negative for abdominal pain, blood in stool, diarrhea, melena, nausea and vomiting.  Genitourinary: Negative for flank pain and hematuria.  Musculoskeletal: Positive  for back pain, myalgias and neck pain.  Skin: Negative.  Negative for rash.  Neurological: Negative for dizziness, sensory change, focal weakness and headaches.  All other systems reviewed and are negative.  Vitals:   04/30/19 1428  BP: 132/78  Pulse: 80  Temp: 99.1 F (37.3 C)  SpO2: 92%     Physical Exam Vitals signs reviewed.  Constitutional:      Appearance: Normal appearance.  HENT:     Head: Normocephalic and atraumatic.  Eyes:     Extraocular Movements: Extraocular movements intact.     Conjunctiva/sclera: Conjunctivae normal.     Pupils: Pupils are equal, round, and reactive to light.  Neck:     Musculoskeletal: Normal range of motion and neck supple. No muscular tenderness.  Cardiovascular:     Rate and Rhythm: Normal rate and regular rhythm.     Pulses: Normal  pulses.     Heart sounds: Normal heart sounds.  Pulmonary:     Effort: Pulmonary effort is normal.     Breath sounds: Normal breath sounds.  Chest:     Chest wall: Tenderness (Localized tenderness to left upper sternal border, pain reproduced with palpation) present.  Abdominal:     Palpations: Abdomen is soft.     Tenderness: There is no abdominal tenderness.  Musculoskeletal:     Comments: Left upper extremity: Some tenderness to triceps area.  No erythema or bruising.  Full range of motion at all joints.  Neurovascularly intact.  Good distal capillary refill.  Lymphadenopathy:     Cervical: No cervical adenopathy.  Skin:    General: Skin is warm and dry.     Capillary Refill: Capillary refill takes less than 2 seconds.  Neurological:     General: No focal deficit present.     Mental Status: He is alert and oriented to person, place, and time.     Sensory: No sensory deficit.     Motor: No weakness.     Deep Tendon Reflexes: Reflexes normal.  Psychiatric:        Mood and Affect: Mood normal.        Behavior: Behavior normal.    Dg Chest 2 View  Result Date: 04/30/2019 CLINICAL DATA:  Chest pain EXAM: CHEST - 2 VIEW COMPARISON:  Radiograph 07/16/2017 FINDINGS: No consolidation, features of edema, pneumothorax, or effusion. Pulmonary vascularity is normally distributed. The cardiomediastinal contours are unremarkable. No acute osseous or soft tissue abnormality. IMPRESSION: No acute cardiopulmonary abnormality. Electronically Signed   By: Kreg ShropshirePrice  DeHay M.D.   On: 04/30/2019 14:57     ASSESSMENT & PLAN: [name] was seen today for sharp pain arm.  Diagnoses and all orders for this visit:  Nonspecific chest pain -     DG Chest 2 View; Future  Acute costochondritis -     meloxicam (MOBIC) 15 MG tablet; Take 1 tablet (15 mg total) by mouth daily for 10 days.  Tendinitis of left triceps -     meloxicam (MOBIC) 15 MG tablet; Take 1 tablet (15 mg total) by mouth daily for 10  days.    Patient Instructions       If you have lab work done today you will be contacted with your lab results within the next 2 weeks.  If you have not heard from us then please contact us. The fastest way to get your results is to register for My Chart.   IF you received an x-ray today, you will receive an invoice from [facility].  Please contact [facility] at [phone] with questions or concerns regarding your invoice.   IF you received labwork today, you will receive an invoice from [facility]. Please contact [facility] at [phone] with questions or concerns regarding your invoice.   Our billing staff will not be able to assist you with questions regarding bills from these companies.  You will be contacted with the lab results as soon as they are available. The fastest way to get your results is to activate your My Chart account. Instructions are located on the last page of this paperwork. If you have not heard from [place] regarding the results in 2 weeks, please contact this office.      Tendinitis  Tendinitis is inflammation of a tendon. A tendon is a strong cord of tissue that connects muscle to bone. Tendinitis can affect any tendon, but it most commonly affects the:  Shoulder tendon (rotator cuff).  Ankle tendon (Achilles tendon).  Elbow tendon (triceps tendon).  Tendons in the wrist. What are the causes? This condition may be caused by:  Overusing a tendon or muscle. This is common.  Age-related wear and tear.  Injury.  Inflammatory conditions, such as arthritis.  Certain medicines. What increases the risk? You are more likely to develop this condition if you do activities that involve the same movements over and over again (repetitive motions). What are the signs or symptoms? Symptoms of this condition may include:  Pain.  Tenderness.  Mild swelling.  Decreased range of motion. How is this diagnosed? This condition is  diagnosed with a physical exam. You may also have tests, such as:  Ultrasound. This uses sound waves to make an image of the inside of your body in the affected area.  MRI. How is this treated? This condition may be treated by resting, icing, applying pressure (compression), and raising (elevating) the affected area above the level of your heart. This is known as RICE therapy. Treatment may also include:  Medicines to help reduce inflammation or to help reduce pain.  Exercises or physical therapy to strengthen and stretch the tendon.  A brace or splint.  Surgery. This is rarely needed. Follow these instructions at home: If you have a splint or brace:  Wear the splint or brace as told by your health care provider. Remove it only as told by your health care provider.  Loosen the splint or brace if your fingers or toes tingle, become numb, or turn cold and blue.  Keep the splint or brace clean.  If the splint or brace is not waterproof: ? Do not let it get wet. ? Cover it with a watertight covering when you take a bath or shower. Managing pain, stiffness, and swelling  If directed, put ice on the affected area. ? If you have a removable splint or brace, remove it as told by your health care provider. ? Put ice in a plastic bag. ? Place a towel between your skin and the bag. ? Leave the ice on for 20 minutes, 2-3 times a day.  Move the fingers or toes of the affected limb often, if this applies. This can help to prevent stiffness and lessen swelling.  If directed, raise (elevate) the affected area above the level of your heart while you are sitting or lying down.  If directed, apply heat to the affected area before you exercise. Use the heat source that your health care provider recommends, such as a moist heat pack or a heating pad.     ?  Place a towel between your skin and the heat source. ? Leave the heat on for 20-30 minutes. ? Remove the heat if your skin turns bright  red. This is especially important if you are unable to feel pain, heat, or cold. You may have a greater risk of getting burned. Driving  Do not drive or use heavy machinery while taking prescription pain medicine.  Ask your health care provider when it is safe to drive if you have a splint or brace on any part of your arm or leg. Activity  Rest the affected area as told by your health care provider.  Return to your normal activities as told by your health care provider. Ask your health care provider what activities are safe for you.  Avoid using the affected area while you are experiencing symptoms of tendinitis.  Do exercises as told by your health care provider. General instructions  If you have a splint, do not put pressure on any part of the splint until it is fully hardened. This may take several hours.  Wear an elastic bandage or compression wrap only as told by your health care provider.  Take over-the-counter and prescription medicines only as told by your health care provider.  Keep all follow-up visits as told by your health care provider. This is important. Contact a health care provider if:  Your symptoms do not improve.  You develop new, unexplained problems, such as numbness in your hands. Summary  Tendinitis is inflammation of a tendon.  You are more likely to develop this condition if you do activities that involve the same movements over and over again.  This condition may be treated by resting, icing, applying pressure (compression), and elevating the area above the level of your heart. This is known as RICE therapy.  Avoid using the affected area while you are experiencing symptoms of tendinitis. This information is not intended to replace advice given to you by your health care provider. Make sure you discuss any questions you have with your health care provider. Document Released: 08/18/2000 Document Revised: 02/26/2018 Document Reviewed:  01/09/2018 Elsevier Patient Education  2020 Elsevier Inc.  Costochondritis Costochondritis is swelling and irritation (inflammation) of the tissue (cartilage) that connects your ribs to your breastbone (sternum). This causes pain in the front of your chest. Usually, the pain:  Starts gradually.  Is in more than one rib. This condition usually goes away on its own over time. Follow these instructions at home:  Do not do anything that makes your pain worse.  If directed, put ice on the painful area: ? Put ice in a plastic bag. ? Place a towel between your skin and the bag. ? Leave the ice on for 20 minutes, 2-3 times a day.  If directed, put heat on the affected area as often as told by your doctor. Use the heat source that your doctor tells you to use, such as a moist heat pack or a heating pad. ? Place a towel between your skin and the heat source. ? Leave the heat on for 20-30 minutes. ? Take off the heat if your skin turns bright red. This is very important if you cannot feel pain, heat, or cold. You may have a greater risk of getting burned.  Take over-the-counter and prescription medicines only as told by your doctor.  Return to your normal activities as told by your doctor. Ask your doctor what activities are safe for you.  Keep all follow-up visits as told by  your doctor. This is important. Contact a doctor if:  You have chills or a fever.  Your pain does not go away or it gets worse.  You have a cough that does not go away. Get help right away if:  You are short of breath. This information is not intended to replace advice given to you by your health care provider. Make sure you discuss any questions you have with your health care provider. Document Released: 02/07/2008 Document Revised: 09/05/2017 Document Reviewed: 12/15/2015 Elsevier Patient Education  2020 Elsevier Inc.      Agustina Caroli, MD [facility]

## 2019-05-05 ENCOUNTER — Ambulatory Visit (INDEPENDENT_AMBULATORY_CARE_PROVIDER_SITE_OTHER): Payer: BC Managed Care – PPO | Admitting: Cardiology

## 2019-05-05 ENCOUNTER — Other Ambulatory Visit: Payer: Self-pay

## 2019-05-05 ENCOUNTER — Encounter: Payer: Self-pay | Admitting: Cardiology

## 2019-05-05 VITALS — BP 128/74 | HR 82 | Temp 97.7°F | Ht 68.0 in | Wt 171.3 lb

## 2019-05-05 DIAGNOSIS — R9431 Abnormal electrocardiogram [ECG] [EKG]: Secondary | ICD-10-CM | POA: Diagnosis not present

## 2019-05-05 DIAGNOSIS — R0789 Other chest pain: Secondary | ICD-10-CM

## 2019-05-05 NOTE — Progress Notes (Signed)
Primary Physician:  Horald Pollen, MD   Patient ID: Allen [name], male    DOB: 05-25-1981, 38 y.o.   MRN: [id number]  Subjective:    Chief Complaint  Patient presents with  . Chest Pain  . Results    echo  . Follow-up    HPI: Allen [name]  is a 38 y.o. male  with  Hyperlipidemia last seen in 2018 for musculoskeletal chest pain and abnormal EKG, underwent echocardiogram that revealed moderate LVH, normal LVEF. Treadmill stress test done 2 years ago was considered low risk study. He now presents for follow up on chest pain.   Chest pain is described as sharp pain that comes in the left upper part of the chest both at rest and with exertion. Did have one episode of chest pain that radiated through to his back.  Describes it as mild. Symptoms last for a few seconds and if occur with activity, do not make him stop what he is doing. No left arm radiation or associated shortness of breath. Previously lipids were elevated; however, had stopped medication as he felt could control with diet. He also has pending referral to GI.  There is no family history of sudden cardiac death or premature coronary artery disease. He is working in the [employer] and is active with this and with push mowing his yard, but does not exercise.   History reviewed. No pertinent past medical history.  Past Surgical History:  Procedure Laterality Date  . None      Social History   Socioeconomic History  . Marital status: Married    Spouse name: Not on file  . Number of children: 0  . Years of education: Not on file  . Highest education level: Not on file  Occupational History  . Not on file  Social Needs  . Financial resource strain: Not on file  . Food insecurity    Worry: Not on file    Inability: Not on file  . Transportation needs    Medical: Not on file    Non-medical: Not on file  Tobacco Use  . Smoking status: Never Smoker  . Smokeless tobacco: Never Used  Substance and Sexual  Activity  . Alcohol use: Yes    Comment: Occassionlly   . Drug use: No  . Sexual activity: Not on file  Lifestyle  . Physical activity    Days per week: Not on file    Minutes per session: Not on file  . Stress: Not on file  Relationships  . Social Herbalist on phone: Not on file    Gets together: Not on file    Attends religious service: Not on file    Active member of club or organization: Not on file    Attends meetings of clubs or organizations: Not on file    Relationship status: Not on file  . Intimate partner violence    Fear of current or ex partner: Not on file    Emotionally abused: Not on file    Physically abused: Not on file    Forced sexual activity: Not on file  Other Topics Concern  . Not on file  Social History Narrative  . Not on file   Review of Systems  Constitution: Negative for decreased appetite, malaise/fatigue, weight gain and weight loss.  Eyes: Negative for visual disturbance.  Cardiovascular: Positive for chest pain. Negative for claudication, dyspnea on exertion, leg swelling, orthopnea, palpitations and syncope.  Respiratory:  Negative for hemoptysis and wheezing.   Endocrine: Negative for cold intolerance and heat intolerance.  Hematologic/Lymphatic: Does not bruise/bleed easily.  Skin: Negative for nail changes.  Musculoskeletal: Negative for muscle weakness and myalgias.  Gastrointestinal: Positive for heartburn. Negative for abdominal pain, change in bowel habit, nausea and vomiting.  Neurological: Negative for difficulty with concentration, dizziness, focal weakness and headaches.  Psychiatric/Behavioral: Negative for altered mental status and suicidal ideas.  All other systems reviewed and are negative.  Objective:  Blood pressure 128/74, pulse 82, temperature 97.7 F (36.5 C), height '5\' 8"'  (1.727 m), weight 171 lb 4.8 oz (77.7 kg), SpO2 96 %. Body mass index is 26.05 kg/m.    Physical Exam  Constitutional: He is oriented  to person, place, and time. Vital signs are normal. He appears well-developed and well-nourished.  HENT:  Head: Normocephalic and atraumatic.  Neck: Normal range of motion.  Cardiovascular: Normal rate, regular rhythm, normal heart sounds and intact distal pulses.  Pulmonary/Chest: Effort normal and breath sounds normal. No accessory muscle usage. No respiratory distress.  Abdominal: Soft. Bowel sounds are normal.  Musculoskeletal: Normal range of motion.  Neurological: He is alert and oriented to person, place, and time.  Skin: Skin is warm and dry.  Vitals reviewed.  Radiology: No results found.  Laboratory examination:    CMP Latest Ref Rng & Units 03/13/2019 07/05/2018 01/04/2017  Glucose 65 - 99 mg/dL 86 110(H) 94  BUN 6 - 20 mg/dL '14 14 16  ' Creatinine 0.76 - 1.27 mg/dL 1.01 1.13 0.99  Sodium 134 - 144 mmol/L 140 137 141  Potassium 3.5 - 5.2 mmol/L 4.1 4.6 4.4  Chloride 96 - 106 mmol/L 100 102 102  CO2 20 - 29 mmol/L '22 29 26  ' Calcium 8.7 - 10.2 mg/dL 9.6 9.6 9.4  Total Protein 6.0 - 8.5 g/dL 7.9 7.9 7.1  Total Bilirubin 0.0 - 1.2 mg/dL 0.4 0.8 0.3  Alkaline Phos 39 - 117 IU/L 64 57 67  AST 0 - 40 IU/L '20 22 20  ' ALT 0 - 44 IU/L '16 23 22   ' CBC Latest Ref Rng & Units 03/13/2019 07/05/2018 01/04/2017  WBC 3.4 - 10.8 x10E3/uL 4.4 6.2 5.1  Hemoglobin 13.0 - 17.7 g/dL 15.2 15.4 15.5  Hematocrit 37.5 - 51.0 % 45.5 46.6 47.8  Platelets 150 - 450 x10E3/uL 185 186 190   Lipid Panel     Component Value Date/Time   CHOL 178 01/04/2017 1223   TRIG 104 01/04/2017 1223   HDL 58 01/04/2017 1223   CHOLHDL 3.1 01/04/2017 1223   LDLCALC 99 01/04/2017 1223   HEMOGLOBIN A1C No results found for: HGBA1C, MPG TSH No results for input(s): TSH in the last 8760 hours.  PRN Meds:. There are no discontinued medications. Current Meds  Medication Sig  . fexofenadine (ALLEGRA) 180 MG tablet Take 180 mg by mouth daily.  . meloxicam (MOBIC) 15 MG tablet Take 1 tablet (15 mg total) by mouth daily  for 10 days.  . Multiple Vitamin (MULTIVITAMIN) tablet Take 1 tablet by mouth as needed.    Cardiac Studies:   Treadmill exercise stress test 02/05/2017: Indication: CP, Abn EKG Resting EKG demonstrates NSR. Borderline criteria for LVH. The patient exercised according to Bruce Protocol, Total time recorded 10:11 min achieving max heart rate of 169 which was 90% of THR for age and 12.09 METS of work. Stress terminated due to THR (>85% MPHR)/MPHR met. Normal BP response. There was 2 mm upsloping ST depression at peak exercise which  normalized immediately in recovery. The test is negative for ischemia. There were no significant arrhythmias. Normal BP response. Rec: No e/o ischemia by GXT. Exercise tolerence is excellent. Continue Preventive therapy.  Echocardiogram 03/17/2019: Normal LV systolic function with EF 55%. Left ventricle cavity is normal in size. Mild concentric hypertrophy of the left ventricle. Normal global wall motion. Normal diastolic filling pattern.  No significant valvular abnormalities. No evidence of pulmonary hypertension. No significant change compared to previous study in 2018.   Assessment:     ICD-10-CM   1. Musculoskeletal chest pain  R07.89   2. Abnormal EKG  R94.31    EKG 03/14/2019: Normal sinus rhythm at 62 bpm, left atrial enlargement, normal axis, T wave abnormality in inferior leads, LVH. Normal QT interval. No changes compared to EKG in 2018.   Recommendations:   I had seen him in 2018 and again was seen about 6 weeks ago, for chest pain, clearly appears to be musculoskeletal chest pain.  He does have indeed an abnormal EKG with T-wave inversion in the inferior leads but without any other physical exam abnormality, no change from prior EKG in 2018, patient continues to remain active and plays tennis without any chest pain or inducible exertional this comfort in the chest or dyspnea, no further evaluation is indicated.    I reviewed the results of the  echocardiogram, although mild LVH was noted, blood pressure is very well controlled.  Do not suspect any cardiomyopathy.  I simply reassured him.  I'll see him back on a p.r.n. basis.  I have discussed with the patient regarding the signs and symptoms of angina pectoris, presentation and when to seek help. Diffenentiating non anginal pain discussed.    Adrian Prows, MD, [facility] 05/05/2019, 2:26 PM [facility]. [place] Pager: [phone] Office: [phone] If no answer Cell [phone]

## 2019-05-23 ENCOUNTER — Other Ambulatory Visit: Payer: Self-pay | Admitting: *Deleted

## 2019-05-23 DIAGNOSIS — Z20822 Contact with and (suspected) exposure to covid-19: Secondary | ICD-10-CM

## 2019-05-23 DIAGNOSIS — R6889 Other general symptoms and signs: Secondary | ICD-10-CM | POA: Diagnosis not present

## 2019-05-24 LAB — NOVEL CORONAVIRUS, NAA: SARS-CoV-2, NAA: NOT DETECTED

## 2019-05-27 ENCOUNTER — Other Ambulatory Visit: Payer: Self-pay

## 2019-05-27 ENCOUNTER — Encounter: Payer: Self-pay | Admitting: Emergency Medicine

## 2019-05-27 ENCOUNTER — Ambulatory Visit: Payer: BC Managed Care – PPO | Admitting: Emergency Medicine

## 2019-05-27 VITALS — BP 117/74 | HR 80 | Temp 98.2°F | Resp 16 | Ht 69.0 in | Wt 167.4 lb

## 2019-05-27 DIAGNOSIS — R7303 Prediabetes: Secondary | ICD-10-CM | POA: Diagnosis not present

## 2019-05-27 DIAGNOSIS — M79602 Pain in left arm: Secondary | ICD-10-CM

## 2019-05-27 DIAGNOSIS — M65222 Calcific tendinitis, left upper arm: Secondary | ICD-10-CM

## 2019-05-27 DIAGNOSIS — J302 Other seasonal allergic rhinitis: Secondary | ICD-10-CM | POA: Diagnosis not present

## 2019-05-27 MED ORDER — MONTELUKAST SODIUM 10 MG PO TABS: 10.0000 mg | ORAL_TABLET | Freq: Every day | ORAL | 3 refills | Status: DC

## 2019-05-27 NOTE — Patient Instructions (Addendum)
If you have lab work done today you will be contacted with your lab results within the next 2 weeks.  If you have not heard from [place] then please contact us. The fastest way to get your results is to register for My Chart.   IF you received an x-ray today, you will receive an invoice from [facility]. Please contact [facility] at [phone] with questions or concerns regarding your invoice.   IF you received labwork today, you will receive an invoice from [facility]. Please contact [facility] at [phone] with questions or concerns regarding your invoice.   Our billing staff will not be able to assist you with questions regarding bills from these companies.  You will be contacted with the lab results as soon as they are available. The fastest way to get your results is to activate your My Chart account. Instructions are located on the last page of this paperwork. If you have not heard from [place] regarding the results in 2 weeks, please contact this office.     Tendinitis  Tendinitis is swelling (inflammation) of a tendon. A tendon is a cord of tissue that connects muscle to bone. Tendinitis can cause pain, tenderness, and swelling. What are the causes?  Using a tendon or muscle too much (overuse). This is a common cause.  Wear and tear that happens as you age.  Injury.  Some medical conditions, such as arthritis.  Some medicines. What increases the risk? You are more likely to get this condition if you do activities that involve the same movements over and over again (repetitive motions). What are the signs or symptoms?  Pain.  Tenderness.  Mild swelling.  Decreased range of motion. How is this treated? This condition is usually treated with RICE therapy. RICE stands for:  Rest.  Ice.  Compression. This means putting pressure on the affected area.  Elevation. This means raising the affected area above the level of your heart. Treatment may also  include:  Medicines for swelling or pain.  Exercises or physical therapy.  A brace or splint.  Surgery. This is rarely needed. Follow these instructions at home: If you have a splint or brace:  Wear the splint or brace as told by your doctor. Remove it only as told by your doctor.  Loosen the splint or brace if your fingers or toes: ? Tingle. ? Become numb. ? Turn cold and blue.  Keep the splint or brace clean.  If the splint or brace is not waterproof: ? Do not let it get wet. ? Cover it with a watertight covering when you take a bath or shower. Managing pain, stiffness, and swelling      If told, put ice on the affected area. ? If you have a removable splint or brace, remove it as told by your doctor. ? Put ice in a plastic bag. ? Place a towel between your skin and the bag. ? Leave the ice on for 20 minutes, 2-3 times a day.  Move the fingers or toes of the affected arm or leg often, if this applies. This helps to prevent stiffness and to lessen swelling.  If told, raise the affected area above the level of your heart while you are sitting or lying down.  If told, put heat on the affected area before you exercise. Use the heat source that your doctor recommends, such as a moist heat pack or a heating pad. ? Place a towel between your skin and the  heat source. ? Leave the heat on for 20-30 minutes. ? Remove the heat if your skin turns bright red. This is very important if you are unable to feel pain, heat, or cold. You may have a greater risk of getting burned. Driving  Do not drive or use heavy machinery while taking prescription pain medicine.  Ask your doctor when it is safe to drive if you have a splint or brace on any part of your arm or leg. Activity  Rest the affected area as told by your doctor.  Return to your normal activities as told by your doctor. Ask your doctor what activities are safe for you.  Avoid using the affected area while you have  symptoms.  Do exercises as told by your doctor. General instructions  If you have a splint, do not put pressure on any part of the splint until it is fully hardened. This may take several hours.  Wear an elastic bandage or pressure (compression) wrap only as told by your doctor.  Take over-the-counter and prescription medicines only as told by your doctor.  Keep all follow-up visits as told by your doctor. This is important. Contact a doctor if:  You do not get better.  You get new problems, such as numbness in your hands, and you do not know why. Summary  Tendinitis is swelling (inflammation) of a tendon.  You are more likely to get this condition if you do activities that involve the same movements over and over again.  This condition is usually treated with RICE therapy. RICE stands for rest, ice, compression, and elevate.  Avoid using the affected area while you have symptoms. This information is not intended to replace advice given to you by your health care provider. Make sure you discuss any questions you have with your health care provider. Document Released: 12/01/2010 Document Revised: 02/26/2018 Document Reviewed: 01/09/2018 Elsevier Patient Education  2020 [employer].  Allergic Rhinitis, Adult Allergic rhinitis is a reaction to allergens in the air. Allergens are tiny specks (particles) in the air that cause your body to have an allergic reaction. This condition cannot be passed from person to person (is not contagious). Allergic rhinitis cannot be cured, but it can be controlled. There are two types of allergic rhinitis:  Seasonal. This type is also called hay fever. It happens only during certain times of the year.  Perennial. This type can happen at any time of the year. What are the causes? This condition may be caused by:  Pollen from grasses, trees, and weeds.  House dust mites.  Pet dander.  Mold. What are the signs or symptoms? Symptoms of this  condition include:  Sneezing.  Runny or stuffy nose (nasal congestion).  A lot of mucus in the back of the throat (postnasal drip).  Itchy nose.  Tearing of the eyes.  Trouble sleeping.  Being sleepy during day. How is this treated? There is no cure for this condition. You should avoid things that trigger your symptoms (allergens). Treatment can help to relieve symptoms. This may include:  Medicines that block allergy symptoms, such as antihistamines. These may be given as a shot, nasal spray, or pill.  Shots that are given until your body becomes less sensitive to the allergen (desensitization).  Stronger medicines, if all other treatments have not worked. Follow these instructions at home: Avoiding allergens   Find out what you are allergic to. Common allergens include smoke, dust, and pollen.  Avoid them if you can. These  are some of the things that you can do to avoid allergens: ? Replace carpet with wood, tile, or vinyl flooring. Carpet can trap dander and dust. ? Clean any mold found in the home. ? Do not smoke. Do not allow smoking in your home. ? Change your heating and air conditioning filter at least once a month. ? During allergy season:  Keep windows closed as much as you can. If possible, use air conditioning when there is a lot of pollen in the air.  Use a special filter for allergies with your furnace and air conditioner.  Plan outdoor activities when pollen counts are lowest. This is usually during the early morning or evening hours.  If you do go outdoors when pollen count is high, wear a special mask for people with allergies.  When you come indoors, take a shower and change your clothes before sitting on furniture or bedding. General instructions  Do not use fans in your home.  Do not hang clothes outside to dry.  Wear sunglasses to keep pollen out of your eyes.  Wash your hands right away after you touch household pets.  Take over-the-counter  and prescription medicines only as told by your doctor.  Keep all follow-up visits as told by your doctor. This is important. Contact a doctor if:  You have a fever.  You have a cough that does not go away (is persistent).  You start to make whistling sounds when you breathe (wheeze).  Your symptoms do not get better with treatment.  You have thick fluid coming from your nose.  You start to have nosebleeds. Get help right away if:  Your tongue or your lips are swollen.  You have trouble breathing.  You feel dizzy or you feel like you are going to pass out (faint).  You have cold sweats. Summary  Allergic rhinitis is a reaction to allergens in the air.  This condition may be caused by allergens. These include pollen, dust mites, pet dander, and mold.  Symptoms include a runny, itchy nose, sneezing, or tearing eyes. You may also have trouble sleeping or feel sleepy during the day.  Treatment includes taking medicines and avoiding allergens. You may also get shots or take stronger medicines.  Get help if you have a fever or a cough that does not stop. Get help right away if you are short of breath. This information is not intended to replace advice given to you by your health care provider. Make sure you discuss any questions you have with your health care provider. Document Released: 12/21/2010 Document Revised: 12/10/2018 Document Reviewed: 03/12/2018 Elsevier Patient Education  2020 [employer].

## 2019-05-27 NOTE — Progress Notes (Addendum)
Allen [name] 38 y.o.   Chief Complaint  Patient presents with  . Arm Pain    LEFT per patient it still hurts  . Allergies    seasonal - per patient nasal congestion x 2-3 days ago    HISTORY OF PRESENT ILLNESS: This is a 38 y.o. male complaining of left arm pain for several weeks.  Denies direct injury. Seen by me last month and started on meloxicam 50 mg with some relief but pain persists. Also complaining of seasonal allergies.  Does not tolerate nasal sprays.  Has been taking Allegra with some relief.  HPI   Prior to Admission medications   Medication Sig Start Date End Date Taking? Authorizing Provider  fexofenadine (ALLEGRA) 180 MG tablet Take 180 mg by mouth daily.    [provider]  Multiple Vitamin (MULTIVITAMIN) tablet Take 1 tablet by mouth as needed.    [provider]    No Known Allergies  Patient Active Problem List   Diagnosis Date Noted  . Gastroesophageal reflux disease with esophagitis 02/11/2019    History reviewed. No pertinent past medical history.  Past Surgical History:  Procedure Laterality Date  . None      Social History   Socioeconomic History  . Marital status: Married    Spouse name: Not on file  . Number of children: 0  . Years of education: Not on file  . Highest education level: Not on file  Occupational History  . Not on file  Social Needs  . Financial resource strain: Not on file  . Food insecurity    Worry: Not on file    Inability: Not on file  . Transportation needs    Medical: Not on file    Non-medical: Not on file  Tobacco Use  . Smoking status: Never Smoker  . Smokeless tobacco: Never Used  Substance and Sexual Activity  . Alcohol use: Yes    Comment: Occassionlly   . Drug use: No  . Sexual activity: Not on file  Lifestyle  . Physical activity    Days per week: Not on file    Minutes per session: Not on file  . Stress: Not on file  Relationships  . Social Musician on phone:  Not on file    Gets together: Not on file    Attends religious service: Not on file    Active member of club or organization: Not on file    Attends meetings of clubs or organizations: Not on file    Relationship status: Not on file  . Intimate partner violence    Fear of current or ex partner: Not on file    Emotionally abused: Not on file    Physically abused: Not on file    Forced sexual activity: Not on file  Other Topics Concern  . Not on file  Social History Narrative  . Not on file    Family History  Problem Relation Age of Onset  . Diabetes Mother   . Diabetes Father      Review of Systems  Constitutional: Negative.  Negative for chills and fever.  HENT: Positive for congestion. Negative for sore throat.        Ear itching  Eyes: Negative.   Respiratory: Negative.  Negative for cough and shortness of breath.   Cardiovascular: Negative.  Negative for chest pain and palpitations.  Gastrointestinal: Negative.  Negative for abdominal pain, diarrhea, nausea and vomiting.  Genitourinary: Negative.  Negative for  dysuria and hematuria.  Musculoskeletal:       Left arm muscle pain  Skin: Negative.  Negative for rash.  Neurological: Negative for dizziness and headaches.  Endo/Heme/Allergies: Positive for environmental allergies.  All other systems reviewed and are negative.   Today's Vitals   05/27/19 1218  BP: 117/74  Pulse: 80  Resp: 16  Temp: 98.2 F (36.8 C)  TempSrc: Oral  SpO2: 99%  Weight: 167 lb 6.4 oz (75.9 kg)  Height: 5\' 9"  (1.753 m)   Body mass index is 24.72 kg/m.  Physical Exam Vitals signs reviewed.  Constitutional:      Appearance: Normal appearance.  HENT:     Head: Normocephalic.     Right Ear: Tympanic membrane, ear canal and external ear normal.     Left Ear: Tympanic membrane, ear canal and external ear normal.     Nose: Nose normal.     Mouth/Throat:     Mouth: Mucous membranes are moist.     Pharynx: Oropharynx is clear.  Eyes:      Extraocular Movements: Extraocular movements intact.     Conjunctiva/sclera: Conjunctivae normal.     Pupils: Pupils are equal, round, and reactive to light.  Neck:     Musculoskeletal: Normal range of motion and neck supple.  Cardiovascular:     Rate and Rhythm: Normal rate and regular rhythm.     Heart sounds: Normal heart sounds.  Pulmonary:     Effort: Pulmonary effort is normal.     Breath sounds: Normal breath sounds.  Musculoskeletal:     Comments: Left arm: Some muscle tenderness to upper arm.  Full range of motion at shoulder, elbow, and wrist.  Neurovascularly intact.  No rashes or bruising.  Skin:    General: Skin is warm and dry.     Capillary Refill: Capillary refill takes less than 2 seconds.  Neurological:     General: No focal deficit present.     Mental Status: He is alert and oriented to person, place, and time.  Psychiatric:        Mood and Affect: Mood normal.        Behavior: Behavior normal.      ASSESSMENT & PLAN: [name] was seen today for arm pain and allergies.  Diagnoses and all orders for this visit:  Calcific tendinitis of left upper arm -     Ambulatory referral to Orthopedic Surgery  Seasonal allergies -     CBC with Differential/Platelet -     Comprehensive metabolic panel -     Hemoglobin A1c -     Vitamin B12 -     VITAMIN D 25 Hydroxy (Vit-D Deficiency, Fractures) -     montelukast (SINGULAIR) 10 MG tablet; Take 1 tablet (10 mg total) by mouth at bedtime.  Left arm pain -     Ambulatory referral to Orthopedic Surgery  Prediabetes     Patient Instructions       If you have lab work done today you will be contacted with your lab results within the next 2 weeks.  If you have not heard from Daphine Deutscher then please contact us. The fastest way to get your results is to register for My Chart.   IF you received an x-ray today, you will receive an invoice from [facility]. Please contact [facility] at [phone] with  questions or concerns regarding your invoice.   IF you received labwork today, you will receive an invoice from [phone]. Please contact [facility] at  [phone] with questions or concerns regarding your invoice.   Our billing staff will not be able to assist you with questions regarding bills from these companies.  You will be contacted with the lab results as soon as they are available. The fastest way to get your results is to activate your My Chart account. Instructions are located on the last page of this paperwork. If you have not heard from [place] regarding the results in 2 weeks, please contact this office.     Tendinitis  Tendinitis is swelling (inflammation) of a tendon. A tendon is a cord of tissue that connects muscle to bone. Tendinitis can cause pain, tenderness, and swelling. What are the causes?  Using a tendon or muscle too much (overuse). This is a common cause.  Wear and tear that happens as you age.  Injury.  Some medical conditions, such as arthritis.  Some medicines. What increases the risk? You are more likely to get this condition if you do activities that involve the same movements over and over again (repetitive motions). What are the signs or symptoms?  Pain.  Tenderness.  Mild swelling.  Decreased range of motion. How is this treated? This condition is usually treated with RICE therapy. RICE stands for:  Rest.  Ice.  Compression. This means putting pressure on the affected area.  Elevation. This means raising the affected area above the level of your heart. Treatment may also include:  Medicines for swelling or pain.  Exercises or physical therapy.  A brace or splint.  Surgery. This is rarely needed. Follow these instructions at home: If you have a splint or brace:  Wear the splint or brace as told by your doctor. Remove it only as told by your doctor.  Loosen the splint or brace if your fingers or toes: ? Tingle. ? Become numb. ?  Turn cold and blue.  Keep the splint or brace clean.  If the splint or brace is not waterproof: ? Do not let it get wet. ? Cover it with a watertight covering when you take a bath or shower. Managing pain, stiffness, and swelling      If told, put ice on the affected area. ? If you have a removable splint or brace, remove it as told by your doctor. ? Put ice in a plastic bag. ? Place a towel between your skin and the bag. ? Leave the ice on for 20 minutes, 2-3 times a day.  Move the fingers or toes of the affected arm or leg often, if this applies. This helps to prevent stiffness and to lessen swelling.  If told, raise the affected area above the level of your heart while you are sitting or lying down.  If told, put heat on the affected area before you exercise. Use the heat source that your doctor recommends, such as a moist heat pack or a heating pad. ? Place a towel between your skin and the heat source. ? Leave the heat on for 20-30 minutes. ? Remove the heat if your skin turns bright red. This is very important if you are unable to feel pain, heat, or cold. You may have a greater risk of getting burned. Driving  Do not drive or use heavy machinery while taking prescription pain medicine.  Ask your doctor when it is safe to drive if you have a splint or brace on any part of your arm or leg. Activity  Rest the affected area as told by your doctor.  Return  to your normal activities as told by your doctor. Ask your doctor what activities are safe for you.  Avoid using the affected area while you have symptoms.  Do exercises as told by your doctor. General instructions  If you have a splint, do not put pressure on any part of the splint until it is fully hardened. This may take several hours.  Wear an elastic bandage or pressure (compression) wrap only as told by your doctor.  Take over-the-counter and prescription medicines only as told by your doctor.  Keep all  follow-up visits as told by your doctor. This is important. Contact a doctor if:  You do not get better.  You get new problems, such as numbness in your hands, and you do not know why. Summary  Tendinitis is swelling (inflammation) of a tendon.  You are more likely to get this condition if you do activities that involve the same movements over and over again.  This condition is usually treated with RICE therapy. RICE stands for rest, ice, compression, and elevate.  Avoid using the affected area while you have symptoms. This information is not intended to replace advice given to you by your health care provider. Make sure you discuss any questions you have with your health care provider. Document Released: 12/01/2010 Document Revised: 02/26/2018 Document Reviewed: 01/09/2018 Elsevier Patient Education  2020 [employer].  Allergic Rhinitis, Adult Allergic rhinitis is a reaction to allergens in the air. Allergens are tiny specks (particles) in the air that cause your body to have an allergic reaction. This condition cannot be passed from person to person (is not contagious). Allergic rhinitis cannot be cured, but it can be controlled. There are two types of allergic rhinitis:  Seasonal. This type is also called hay fever. It happens only during certain times of the year.  Perennial. This type can happen at any time of the year. What are the causes? This condition may be caused by:  Pollen from grasses, trees, and weeds.  House dust mites.  Pet dander.  Mold. What are the signs or symptoms? Symptoms of this condition include:  Sneezing.  Runny or stuffy nose (nasal congestion).  A lot of mucus in the back of the throat (postnasal drip).  Itchy nose.  Tearing of the eyes.  Trouble sleeping.  Being sleepy during day. How is this treated? There is no cure for this condition. You should avoid things that trigger your symptoms (allergens). Treatment can help to relieve  symptoms. This may include:  Medicines that block allergy symptoms, such as antihistamines. These may be given as a shot, nasal spray, or pill.  Shots that are given until your body becomes less sensitive to the allergen (desensitization).  Stronger medicines, if all other treatments have not worked. Follow these instructions at home: Avoiding allergens   Find out what you are allergic to. Common allergens include smoke, dust, and pollen.  Avoid them if you can. These are some of the things that you can do to avoid allergens: ? Replace carpet with wood, tile, or vinyl flooring. Carpet can trap dander and dust. ? Clean any mold found in the home. ? Do not smoke. Do not allow smoking in your home. ? Change your heating and air conditioning filter at least once a month. ? During allergy season:  Keep windows closed as much as you can. If possible, use air conditioning when there is a lot of pollen in the air.  Use a special filter for allergies with  your furnace and air conditioner.  Plan outdoor activities when pollen counts are lowest. This is usually during the early morning or evening hours.  If you do go outdoors when pollen count is high, wear a special mask for people with allergies.  When you come indoors, take a shower and change your clothes before sitting on furniture or bedding. General instructions  Do not use fans in your home.  Do not hang clothes outside to dry.  Wear sunglasses to keep pollen out of your eyes.  Wash your hands right away after you touch household pets.  Take over-the-counter and prescription medicines only as told by your doctor.  Keep all follow-up visits as told by your doctor. This is important. Contact a doctor if:  You have a fever.  You have a cough that does not go away (is persistent).  You start to make whistling sounds when you breathe (wheeze).  Your symptoms do not get better with treatment.  You have thick fluid coming  from your nose.  You start to have nosebleeds. Get help right away if:  Your tongue or your lips are swollen.  You have trouble breathing.  You feel dizzy or you feel like you are going to pass out (faint).  You have cold sweats. Summary  Allergic rhinitis is a reaction to allergens in the air.  This condition may be caused by allergens. These include pollen, dust mites, pet dander, and mold.  Symptoms include a runny, itchy nose, sneezing, or tearing eyes. You may also have trouble sleeping or feel sleepy during the day.  Treatment includes taking medicines and avoiding allergens. You may also get shots or take stronger medicines.  Get help if you have a fever or a cough that does not stop. Get help right away if you are short of breath. This information is not intended to replace advice given to you by your health care provider. Make sure you discuss any questions you have with your health care provider. Document Released: 12/21/2010 Document Revised: 12/10/2018 Document Reviewed: 03/12/2018 Elsevier Patient Education  2020 [employer]      Edwina BarthMiguel Brittini Brubeck, MD [facility]

## 2019-05-28 ENCOUNTER — Encounter: Payer: Self-pay | Admitting: Emergency Medicine

## 2019-05-28 LAB — COMPREHENSIVE METABOLIC PANEL
ALT: 17 IU/L (ref 0–44)
AST: 19 IU/L (ref 0–40)
Albumin/Globulin Ratio: 1.7 (ref 1.2–2.2)
Albumin: 4.7 g/dL (ref 4.0–5.0)
Alkaline Phosphatase: 72 IU/L (ref 39–117)
BUN/Creatinine Ratio: 18 (ref 9–20)
BUN: 16 mg/dL (ref 6–20)
Bilirubin Total: 0.4 mg/dL (ref 0.0–1.2)
CO2: 24 mmol/L (ref 20–29)
Calcium: 9.5 mg/dL (ref 8.7–10.2)
Chloride: 100 mmol/L (ref 96–106)
Creatinine, Ser: 0.91 mg/dL (ref 0.76–1.27)
GFR calc Af Amer: 126 mL/min/{1.73_m2} (ref >59)
GFR calc non Af Amer: 109 mL/min/{1.73_m2} (ref >59)
Globulin, Total: 2.8 g/dL (ref 1.5–4.5)
Glucose: 78 mg/dL (ref 65–99)
Potassium: 4.4 mmol/L (ref 3.5–5.2)
Sodium: 139 mmol/L (ref 134–144)
Total Protein: 7.5 g/dL (ref 6.0–8.5)

## 2019-05-28 LAB — CBC WITH DIFFERENTIAL/PLATELET
Basophils Absolute: 0 10*3/uL (ref 0.0–0.2)
Basos: 1 %
EOS (ABSOLUTE): 0.4 10*3/uL (ref 0.0–0.4)
Eos: 10 %
Hematocrit: 46.3 % (ref 37.5–51.0)
Hemoglobin: 15.6 g/dL (ref 13.0–17.7)
Immature Grans (Abs): 0 10*3/uL (ref 0.0–0.1)
Immature Granulocytes: 0 %
Lymphocytes Absolute: 1.8 10*3/uL (ref 0.7–3.1)
Lymphs: 46 %
MCH: 29.3 pg (ref 26.6–33.0)
MCHC: 33.7 g/dL (ref 31.5–35.7)
MCV: 87 fL (ref 79–97)
Monocytes Absolute: 0.3 10*3/uL (ref 0.1–0.9)
Monocytes: 7 %
Neutrophils Absolute: 1.4 10*3/uL (ref 1.4–7.0)
Neutrophils: 36 %
Platelets: 183 10*3/uL (ref 150–450)
RBC: 5.33 x10E6/uL (ref 4.14–5.80)
RDW: 13.1 % (ref 11.6–15.4)
WBC: 3.9 10*3/uL (ref 3.4–10.8)

## 2019-05-28 LAB — HEMOGLOBIN A1C
Est. average glucose Bld gHb Est-mCnc: 117 mg/dL
Hgb A1c MFr Bld: 5.7 % — ABNORMAL HIGH (ref 4.8–5.6)

## 2019-05-28 LAB — VITAMIN B12: Vitamin B-12: 874 pg/mL (ref 232–1245)

## 2019-05-28 LAB — VITAMIN D 25 HYDROXY (VIT D DEFICIENCY, FRACTURES): Vit D, 25-Hydroxy: 37.5 ng/mL (ref 30.0–100.0)

## 2019-06-04 ENCOUNTER — Encounter: Payer: Self-pay | Admitting: Orthopaedic Surgery

## 2019-06-04 ENCOUNTER — Encounter: Payer: Self-pay | Admitting: Gastroenterology

## 2019-06-04 ENCOUNTER — Ambulatory Visit: Payer: BC Managed Care – PPO | Admitting: Gastroenterology

## 2019-06-04 ENCOUNTER — Other Ambulatory Visit: Payer: Self-pay

## 2019-06-04 ENCOUNTER — Ambulatory Visit: Payer: Self-pay

## 2019-06-04 ENCOUNTER — Ambulatory Visit (INDEPENDENT_AMBULATORY_CARE_PROVIDER_SITE_OTHER): Payer: BC Managed Care – PPO | Admitting: Orthopaedic Surgery

## 2019-06-04 VITALS — BP 122/80 | HR 76 | Temp 98.6°F | Ht 69.0 in | Wt 169.0 lb

## 2019-06-04 VITALS — Ht 69.0 in | Wt 169.0 lb

## 2019-06-04 DIAGNOSIS — M542 Cervicalgia: Secondary | ICD-10-CM | POA: Diagnosis not present

## 2019-06-04 DIAGNOSIS — R0789 Other chest pain: Secondary | ICD-10-CM

## 2019-06-04 MED ORDER — PREDNISONE 10 MG (21) PO TBPK: ORAL_TABLET | ORAL | 0 refills | Status: DC

## 2019-06-04 MED ORDER — PANTOPRAZOLE SODIUM 40 MG PO TBEC: 40.0000 mg | DELAYED_RELEASE_TABLET | Freq: Every day | ORAL | 3 refills | Status: DC

## 2019-06-04 NOTE — Patient Instructions (Addendum)
I recommend a trial of pantoprazole 40 mg taken every morning approximately 30-60 minutes before breakfast.   We will plan an upper endoscopy to further evaluation your esophagus and stomach.   Please call me with any questions or concerns prior to that time.

## 2019-06-04 NOTE — Progress Notes (Signed)
Office Visit Note   Patient: Allen [name]           Date of Birth: [birth year]           MRN: [id number] Visit Date: 06/04/2019              Requested by: Horald Pollen, MD [place],   [id number] PCP: Horald Pollen, MD   Assessment & Plan: Visit Diagnoses:  1. Neck pain     Plan: Impression is left carpal tunnel syndrome and possibly cubital tunnel syndrome.  We will call the patient in a steroid taper and will provide him with a removable wrist splint.  We will also refer him to Dr. Ernestina Patches for a left upper extremity nerve conduction study.  He will follow-up with [place] once that is been completed.  Follow-Up Instructions: Return if symptoms worsen or fail to improve.   Orders:  Orders Placed This Encounter  Procedures  . XR Cervical Spine 2 or 3 views  . Ambulatory referral to Physical Medicine Rehab   Meds ordered this encounter  Medications  . predniSONE (STERAPRED UNI-PAK 21 TAB) 10 MG (21) TBPK tablet    Sig: Take as directed    Dispense:  21 tablet    Refill:  0      Procedures: No procedures performed   Clinical Data: No additional findings.   Subjective: Chief Complaint  Patient presents with  . Arm Pain    LEFT    HPI patient is a pleasant 38 year old right-hand-dominant gentleman who presents our clinic today with left arm pain.  This is been ongoing for the past 4 to 5 weeks.  No specific injury or change in activity.  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  The pain he has is to the entire left arm and is described as an intermittent ache.  There are no specific aggravators.  He does note that he occasionally wakes up in the night with tingling to the hand.  There is nothing that relieves his pain when it does occur.   Review of Systems as detailed in HPI.  All others reviewed and are negative.   Objective: Vital Signs: Ht 5\' 9"  (1.753 m)   Wt 169 lb (76.7 kg)   BMI 24.96 kg/m   Physical Exam  well-developed well-nourished gentleman in no acute distress.  Alert and oriented x3.  Ortho Exam examination of his left shoulder reveals full active painless range of motion in all planes.  Negative empty can negative cross body adduction.  No tenderness to the [facility] joint.  Mild left-sided parascapular trigger point.  Full active painless range of motion of the neck.  Negative Phalen at the wrist.  Positive Tinel at the wrist and elbow.  He has full sensation distally.  No focal weakness.  Specialty Comments:  No specialty comments available.  Imaging: Xr Cervical Spine 2 Or 3 Views  Result Date: 06/04/2019 No acute or structural abnormalities    PMFS History: Patient Active Problem List   Diagnosis Date Noted  . Gastroesophageal reflux disease with esophagitis 02/11/2019   Past Medical History:  Diagnosis Date  . Allergies     Family History  Problem Relation Age of Onset  . Diabetes Mother   . Diabetes Father   . Colon cancer Neg Hx   . Esophageal cancer Neg Hx   . Liver cancer Neg Hx   . Pancreatic cancer Neg Hx   .  Rectal cancer Neg Hx   . Stomach cancer Neg Hx     Past Surgical History:  Procedure Laterality Date  . None     Social History   Occupational History  . Occupation: Delivery  Tobacco Use  . Smoking status: Never Smoker  . Smokeless tobacco: Never Used  Substance and Sexual Activity  . Alcohol use: Yes    Comment: Occassionlly   . Drug use: No    Comment: No marijuana  . Sexual activity: Yes

## 2019-06-04 NOTE — Progress Notes (Signed)
Referring Provider: Maryruth H[place], MD Primary Care Physician:  Horald Pollen, MD  Reason for Consultation: Change in bowel habits, GERD   IMPRESSION:  Atypical chest pain    - evaluation by cardiology Black stools NSAID use for chest pain  Esophageal etiologies include reflux esophagitis, non-reflux esophagitis, eosinophilic esophagitis, esophageal motility disorder, and functional chest pain. No alarm features are present. Given this differential, an EGD is recommended.  Will start pantoprazole 40 mg daily in the meantime.   PLAN: Pantoprazole 40 mg QAM EGD with esophageal and gastric biopsies  Please see the "Patient Instructions" section for addition details about the plan.  HPI: Allen [name] is a 38 y.o. male referred by Dr. Mitchel Honour for atypical chest pain. The history is obtained through the patient and review of his electronic health record. He has hyperlipidemia and mild LDH.  Chest pain is described as sharp pain that comes in the left upper part of the chest both at rest and with exertion. Did have one episode of chest pain that radiated through to his back. Symptoms last for a few seconds and if occur with activity, do not make him stop what he is doing. No left arm radiation or associated shortness of breath. Previously lipids were elevated; however, had stopped medication as he felt could control with diet.   Seen by cardiology.  Had a normal echo.  Has an abnormal EKG but this was unchanged from 2018.  The cardiologist felt that the chest pain was likely musculoskeletal chest pain. No further recommendations made at this time.   More recently under evaluation for left arm pain for several weeks.  Denies direct injury.  Started on meloxicam 50 mg QHS with some relief but pain persists.   He has had two episodes of reflux - both nocturnal symptoms. No evidence for GI bleeding, iron deficiency anemia, anorexia, unexplained weight loss, dysphagia, odynophagia,  persistent vomiting, or gastrointestinal cancer in a first-degree relative.  No dysphonia, neck pain, sore throat.   Intermittent black stools. No diarrhea, constipation, or bright red blood.   Tried baking soda once and he thinks this may have helped.  No other associated symptoms. No identified exacerbating or relieving features.   Labs from 05/27/19: normal CMP, CBC, VitB12, and VitaminD.   No known family history of colon cancer or polyps. No family history of uterine/endometrial cancer, pancreatic cancer or gastric/stomach cancer.   Past Medical History:  Diagnosis Date   Allergies     Past Surgical History:  Procedure Laterality Date   None      Current Outpatient Medications  Medication Sig Dispense Refill   fexofenadine (ALLEGRA) 180 MG tablet Take 180 mg by mouth daily.     montelukast (SINGULAIR) 10 MG tablet Take 1 tablet (10 mg total) by mouth at bedtime. 30 tablet 3   Multiple Vitamin (MULTIVITAMIN) tablet Take 1 tablet by mouth as needed.     No current facility-administered medications for this visit.     Allergies as of 06/04/2019   (No Known Allergies)    Family History  Problem Relation Age of Onset   Diabetes Mother    Diabetes Father    Colon cancer Neg Hx    Esophageal cancer Neg Hx    Liver cancer Neg Hx    Pancreatic cancer Neg Hx    Rectal cancer Neg Hx    Stomach cancer Neg Hx     Social History   Socioeconomic History   Marital status: Married  Spouse name: Not on file   Number of children: 0   Years of education: Not on file   Highest education level: Not on file  Occupational History   Occupation: Delivery  Social Needs   Financial resource strain: Not on file   Food insecurity    Worry: Not on file    Inability: Not on file   Transportation needs    Medical: Not on file    Non-medical: Not on file  Tobacco Use   Smoking status: Never Smoker   Smokeless tobacco: Never Used  Substance and Sexual  Activity   Alcohol use: Yes    Comment: Occassionlly    Drug use: No   Sexual activity: Yes  Lifestyle   Physical activity    Days per week: Not on file    Minutes per session: Not on file   Stress: Not on file  Relationships   Social connections    Talks on phone: Not on file    Gets together: Not on file    Attends religious service: Not on file    Active member of club or organization: Not on file    Attends meetings of clubs or organizations: Not on file    Relationship status: Not on file   Intimate partner violence    Fear of current or ex partner: Not on file    Emotionally abused: Not on file    Physically abused: Not on file    Forced sexual activity: Not on file  Other Topics Concern   Not on file  Social History Narrative   Not on file    Review of Systems: 12 system ROS is negative except as noted above.   Physical Exam: General:   Alert,  well-nourished, pleasant and cooperative in NAD Head:  Normocephalic and atraumatic. Eyes:  Sclera clear, no icterus.   Conjunctiva pink. Ears:  Normal auditory acuity. Nose:  No deformity, discharge,  or lesions. Mouth:  No deformity or lesions.   Neck:  Supple; no masses or thyromegaly. Lungs:  Clear throughout to auscultation.   No wheezes. Heart:  Regular rate and rhythm; no murmurs. Abdomen:  Soft,nontender, nondistended, normal bowel sounds, no rebound or guarding. No hepatosplenomegaly.   Rectal:  Deferred  Msk:  Symmetrical. No boney deformities LAD: No inguinal or umbilical LAD Extremities:  No clubbing or edema. Neurologic:  Alert and  oriented x4;  grossly nonfocal Skin:  Intact without significant lesions or rashes. Psych:  Alert and cooperative. Normal mood and affect.    Marguriete Wootan L. Orvan Falconer, MD, MPH 06/04/2019, 9:48 AM

## 2019-06-20 ENCOUNTER — Encounter: Payer: BC Managed Care – PPO | Admitting: Gastroenterology

## 2019-07-16 ENCOUNTER — Other Ambulatory Visit: Payer: Self-pay

## 2019-07-16 ENCOUNTER — Ambulatory Visit (INDEPENDENT_AMBULATORY_CARE_PROVIDER_SITE_OTHER): Payer: BC Managed Care – PPO | Admitting: Physical Medicine and Rehabilitation

## 2019-07-16 ENCOUNTER — Encounter: Payer: Self-pay | Admitting: Physical Medicine and Rehabilitation

## 2019-07-16 DIAGNOSIS — R202 Paresthesia of skin: Secondary | ICD-10-CM | POA: Diagnosis not present

## 2019-07-16 NOTE — Progress Notes (Signed)
 .  Numeric Pain Rating Scale and Functional Assessment Average Pain 10   In the last MONTH (on 0-10 scale) has pain interfered with the following?  1. General activity like being  able to carry out your everyday physical activities such as walking, climbing stairs, carrying groceries, or moving a chair?  Rating(9)     

## 2019-07-17 NOTE — Progress Notes (Signed)
Allen [name] - 38 y.o. male MRN [id number]  Date of birth: 01-Sep-1981  Office Visit Note: Visit Date: 07/16/2019 PCP: Horald Pollen, MD Referred by: Horald Pollen, *  Subjective: Chief Complaint  Patient presents with  . Left Arm - Pain   HPI:  Allen [name] is a 38 y.o. male who comes in today At the request of Dr. Eduard Roux for left upper extremity electrodiagnostic study.  Patient is right-hand dominant.  He reports 2 months ago he had onset of pain in the left arm.  The pain seems to travel more of a dorsal aspect of the forearm and upper arm into the hand to a degree.  Worse with using the arm and particularly pronating the left forearm and hand.  Denies any specific radicular complaints down either arm.  No right-sided symptoms.  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  He has found nothing so far to help with the pain.  No prior electrodiagnostic studies.  ROS Otherwise per HPI.  Assessment & Plan: Visit Diagnoses:  1. Paresthesia of skin     Plan: Impression: Essentially NORMAL electrodiagnostic study of the left upper limb.    There is no significant electrodiagnostic evidence of nerve entrapment, brachial plexopathy or cervical radiculopathy.    As you know, purely sensory or demyelinating radiculopathies and chemical radiculitis may not be detected with this particular electrodiagnostic study.  Recommendations: 1.  Follow-up with referring physician. 2.  Continue current management of symptoms.  Meds & Orders: No orders of the defined types were placed in this encounter.   Orders Placed This Encounter  Procedures  . NCV with EMG (electromyography)    Follow-up: Return for Eduard Roux, MD.   Procedures: No procedures performed  EMG & NCV Findings: All nerve conduction studies (as indicated in the following tables) were within normal limits.    All examined muscles (as indicated in the following  table) showed no evidence of electrical instability.    Impression: Essentially NORMAL electrodiagnostic study of the left upper limb.    There is no significant electrodiagnostic evidence of nerve entrapment, brachial plexopathy or cervical radiculopathy.    As you know, purely sensory or demyelinating radiculopathies and chemical radiculitis may not be detected with this particular electrodiagnostic study.  Recommendations: 1.  Follow-up with referring physician. 2.  Continue current management of symptoms.  ___________________________ Laurence Spates FAAPMR Board Certified, American Board of Physical Medicine and Rehabilitation    Nerve Conduction Studies Anti Sensory Summary Table   Stim Site NR Peak (ms) Norm Peak (ms) P-T Amp (V) Norm P-T Amp Site1 Site2 Delta-P (ms) Dist (cm) Vel (m/s) Norm Vel (m/s)  Left Median Acr Palm Anti Sensory (2nd Digit)  32.7C  Wrist    3.5 <3.6 28.3 >10 Wrist Palm 1.8 0.0    Palm    1.7 <2.0 30.9         Left Radial Anti Sensory (Base 1st Digit)  32.2C  Wrist    2.1 <3.1 26.1  Wrist Base 1st Digit 2.1 0.0    Left Ulnar Anti Sensory (5th Digit)  32.9C  Wrist    3.0 <3.7 17.8 >15.0 Wrist 5th Digit 3.0 14.0 47 >38   Motor Summary Table   Stim Site NR Onset (ms) Norm Onset (ms) O-P Amp (mV) Norm O-P Amp Site1 Site2 Delta-0 (ms) Dist (cm) Vel (m/s) Norm Vel (m/s)  Left Median Motor (Abd Poll Brev)  32.8C  Wrist    3.4 <4.2 7.8 >5 Elbow Wrist 4.4 23.0 52 >50  Elbow    7.8  4.6         Left Radial Motor (Ext Indicis)  31.7C  8cm    2.3 <2.5 2.7 >1.7 Up Arm 8cm 3.8 23.0 61 >60  Up Arm    6.1  4.6         Left Ulnar Motor (Abd Dig Min)  33.1C  Wrist    2.7 <4.2 7.3 >3 B Elbow Wrist 3.9 23.0 59 >53  B Elbow    6.6  6.4  A Elbow B Elbow 1.8 10.0 56 >53  A Elbow    8.4  4.4          EMG   Side Muscle Nerve Root Ins Act Fibs Psw Amp Dur Poly Recrt Int Dennie Bible Comment  Left Abd Poll Brev Median C8-T1 Nml Nml Nml Nml Nml 0 Nml Nml   Left 1stDorInt  Ulnar C8-T1 Nml Nml Nml Nml Nml 0 Nml Nml   Left PronatorTeres Median C6-7 Nml Nml Nml Nml Nml 0 Nml Nml   Left Biceps Musculocut C5-6 Nml Nml Nml Nml Nml 0 Nml Nml   Left Deltoid Axillary C5-6 Nml Nml Nml Nml Nml 0 Nml Nml     Nerve Conduction Studies Anti Sensory Left/Right Comparison   Stim Site L Lat (ms) R Lat (ms) L-R Lat (ms) L Amp (V) R Amp (V) L-R Amp (%) Site1 Site2 L Vel (m/s) R Vel (m/s) L-R Vel (m/s)  Median Acr Palm Anti Sensory (2nd Digit)  32.7C  Wrist 3.5   28.3   Wrist Palm     Palm 1.7   30.9         Radial Anti Sensory (Base 1st Digit)  32.2C  Wrist 2.1   26.1   Wrist Base 1st Digit     Ulnar Anti Sensory (5th Digit)  32.9C  Wrist 3.0   17.8   Wrist 5th Digit 47     Motor Left/Right Comparison   Stim Site L Lat (ms) R Lat (ms) L-R Lat (ms) L Amp (mV) R Amp (mV) L-R Amp (%) Site1 Site2 L Vel (m/s) R Vel (m/s) L-R Vel (m/s)  Median Motor (Abd Poll Brev)  32.8C  Wrist 3.4   7.8   Elbow Wrist 52    Elbow 7.8   4.6         Radial Motor (Ext Indicis)  31.7C  8cm 2.3   2.7   Up Arm 8cm 61    Up Arm 6.1   4.6         Ulnar Motor (Abd Dig Min)  33.1C  Wrist 2.7   7.3   B Elbow Wrist 59    B Elbow 6.6   6.4   A Elbow B Elbow 56    A Elbow 8.4   4.4            Waveforms:             Clinical History: No specialty comments available.     Objective:  VS:  HT:    WT:   BMI:     BP:   HR: bpm  TEMP: ( )  RESP:  Physical Exam Musculoskeletal:        General: No tenderness.     Comments: Inspection reveals no atrophy of the bilateral APB or FDI or hand intrinsics. There is no swelling, color changes, allodynia  or dystrophic changes. There is 5 out of 5 strength in the bilateral wrist extension, finger abduction and long finger flexion. There is intact sensation to light touch in all dermatomal and peripheral nerve distributions.  He does have some pain with pronating the left arm.  There is a negative Tinel's test at the bilateral wrist and elbow.  There is a negative Phalen's test bilaterally. There is a negative Hoffmann's test bilaterally.  Skin:    General: Skin is warm and dry.     Findings: No erythema or rash.  Neurological:     General: No focal deficit present.     Mental Status: He is alert and oriented to person, place, and time.     Sensory: No sensory deficit.     Motor: No weakness or abnormal muscle tone.     Coordination: Coordination normal.     Gait: Gait normal.  Psychiatric:        Mood and Affect: Mood normal.        Behavior: Behavior normal.        Thought Content: Thought content normal.     Ortho Exam Imaging: No results found.

## 2019-07-18 NOTE — Procedures (Signed)
EMG & NCV Findings: All nerve conduction studies (as indicated in the following tables) were within normal limits.    All examined muscles (as indicated in the following table) showed no evidence of electrical instability.    Impression: Essentially NORMAL electrodiagnostic study of the left upper limb.    There is no significant electrodiagnostic evidence of nerve entrapment, brachial plexopathy or cervical radiculopathy.    As you know, purely sensory or demyelinating radiculopathies and chemical radiculitis may not be detected with this particular electrodiagnostic study.  Recommendations: 1.  Follow-up with referring physician. 2.  Continue current management of symptoms.  ___________________________ Naaman Plummer FAAPMR Board Certified, American Board of Physical Medicine and Rehabilitation    Nerve Conduction Studies Anti Sensory Summary Table   Stim Site NR Peak (ms) Norm Peak (ms) P-T Amp (V) Norm P-T Amp Site1 Site2 Delta-P (ms) Dist (cm) Vel (m/s) Norm Vel (m/s)  Left Median Acr Palm Anti Sensory (2nd Digit)  32.7C  Wrist    3.5 <3.6 28.3 >10 Wrist Palm 1.8 0.0    Palm    1.7 <2.0 30.9         Left Radial Anti Sensory (Base 1st Digit)  32.2C  Wrist    2.1 <3.1 26.1  Wrist Base 1st Digit 2.1 0.0    Left Ulnar Anti Sensory (5th Digit)  32.9C  Wrist    3.0 <3.7 17.8 >15.0 Wrist 5th Digit 3.0 14.0 47 >38   Motor Summary Table   Stim Site NR Onset (ms) Norm Onset (ms) O-P Amp (mV) Norm O-P Amp Site1 Site2 Delta-0 (ms) Dist (cm) Vel (m/s) Norm Vel (m/s)  Left Median Motor (Abd Poll Brev)  32.8C  Wrist    3.4 <4.2 7.8 >5 Elbow Wrist 4.4 23.0 52 >50  Elbow    7.8  4.6         Left Radial Motor (Ext Indicis)  31.7C  8cm    2.3 <2.5 2.7 >1.7 Up Arm 8cm 3.8 23.0 61 >60  Up Arm    6.1  4.6         Left Ulnar Motor (Abd Dig Min)  33.1C  Wrist    2.7 <4.2 7.3 >3 B Elbow Wrist 3.9 23.0 59 >53  B Elbow    6.6  6.4  A Elbow B Elbow 1.8 10.0 56 >53  A Elbow    8.4  4.4           EMG   Side Muscle Nerve Root Ins Act Fibs Psw Amp Dur Poly Recrt Int Dennie Bible Comment  Left Abd Poll Brev Median C8-T1 Nml Nml Nml Nml Nml 0 Nml Nml   Left 1stDorInt Ulnar C8-T1 Nml Nml Nml Nml Nml 0 Nml Nml   Left PronatorTeres Median C6-7 Nml Nml Nml Nml Nml 0 Nml Nml   Left Biceps Musculocut C5-6 Nml Nml Nml Nml Nml 0 Nml Nml   Left Deltoid Axillary C5-6 Nml Nml Nml Nml Nml 0 Nml Nml     Nerve Conduction Studies Anti Sensory Left/Right Comparison   Stim Site L Lat (ms) R Lat (ms) L-R Lat (ms) L Amp (V) R Amp (V) L-R Amp (%) Site1 Site2 L Vel (m/s) R Vel (m/s) L-R Vel (m/s)  Median Acr Palm Anti Sensory (2nd Digit)  32.7C  Wrist 3.5   28.3   Wrist Palm     Palm 1.7   30.9         Radial Anti Sensory (Base 1st Digit)  32.2C  Wrist 2.1   26.1   Wrist Base 1st Digit     Ulnar Anti Sensory (5th Digit)  32.9C  Wrist 3.0   17.8   Wrist 5th Digit 47     Motor Left/Right Comparison   Stim Site L Lat (ms) R Lat (ms) L-R Lat (ms) L Amp (mV) R Amp (mV) L-R Amp (%) Site1 Site2 L Vel (m/s) R Vel (m/s) L-R Vel (m/s)  Median Motor (Abd Poll Brev)  32.8C  Wrist 3.4   7.8   Elbow Wrist 52    Elbow 7.8   4.6         Radial Motor (Ext Indicis)  31.7C  8cm 2.3   2.7   Up Arm 8cm 61    Up Arm 6.1   4.6         Ulnar Motor (Abd Dig Min)  33.1C  Wrist 2.7   7.3   B Elbow Wrist 59    B Elbow 6.6   6.4   A Elbow B Elbow 56    A Elbow 8.4   4.4            Waveforms:

## 2019-07-24 ENCOUNTER — Ambulatory Visit: Payer: BC Managed Care – PPO | Admitting: Orthopaedic Surgery

## 2020-06-14 ENCOUNTER — Ambulatory Visit (INDEPENDENT_AMBULATORY_CARE_PROVIDER_SITE_OTHER): Payer: Federal, State, Local not specified - PPO | Admitting: Emergency Medicine

## 2020-06-14 ENCOUNTER — Other Ambulatory Visit: Payer: Self-pay

## 2020-06-14 ENCOUNTER — Encounter: Payer: Self-pay | Admitting: Emergency Medicine

## 2020-06-14 VITALS — BP 117/78 | HR 67 | Temp 97.9°F | Resp 16 | Ht 71.25 in | Wt 175.0 lb

## 2020-06-14 DIAGNOSIS — Z130 Encounter for screening for diseases of the blood and blood-forming organs and certain disorders involving the immune mechanism: Secondary | ICD-10-CM | POA: Diagnosis not present

## 2020-06-14 DIAGNOSIS — Z8639 Personal history of other endocrine, nutritional and metabolic disease: Secondary | ICD-10-CM

## 2020-06-14 DIAGNOSIS — Z13228 Encounter for screening for other metabolic disorders: Secondary | ICD-10-CM

## 2020-06-14 DIAGNOSIS — Z0001 Encounter for general adult medical examination with abnormal findings: Secondary | ICD-10-CM | POA: Diagnosis not present

## 2020-06-14 DIAGNOSIS — Z1159 Encounter for screening for other viral diseases: Secondary | ICD-10-CM

## 2020-06-14 DIAGNOSIS — Z87898 Personal history of other specified conditions: Secondary | ICD-10-CM

## 2020-06-14 DIAGNOSIS — Z1329 Encounter for screening for other suspected endocrine disorder: Secondary | ICD-10-CM | POA: Diagnosis not present

## 2020-06-14 DIAGNOSIS — Z0000 Encounter for general adult medical examination without abnormal findings: Secondary | ICD-10-CM

## 2020-06-14 DIAGNOSIS — Z13220 Encounter for screening for lipoid disorders: Secondary | ICD-10-CM

## 2020-06-14 LAB — CBC WITH DIFFERENTIAL/PLATELET
Hemoglobin: 15.9 g/dL (ref 13.0–17.7)
Monocytes: 7 %
Neutrophils: 45 %
WBC: 3.7 10*3/uL (ref 3.4–10.8)

## 2020-06-14 LAB — LIPID PANEL

## 2020-06-14 LAB — COMPREHENSIVE METABOLIC PANEL: Chloride: 101 mmol/L (ref 96–106)

## 2020-06-14 NOTE — Progress Notes (Signed)
Allen [name] 39 y.o.   Chief Complaint  Patient presents with  . Annual Exam    HISTORY OF PRESENT ILLNESS: This is a 39 y.o. male here for annual exam. No chronic medical problems. Has history of prediabetes and vitamin D deficiency. Healthy male with a healthy lifestyle. Works at the [employer].  6 days a week.  12-hour shifts. Fully vaccinated against Covid. Has no complaints or medical concerns today.  HPI   Prior to Admission medications   Medication Sig Start Date End Date Taking? Authorizing Provider  Multiple Vitamin (MULTIVITAMIN) tablet Take 1 tablet by mouth as needed.   Yes [provider]  VITAMIN D PO Take by mouth daily.   Yes [provider]  fexofenadine (ALLEGRA) 180 MG tablet Take 180 mg by mouth daily. Patient not taking: Reported on 06/14/2020    [provider]  montelukast (SINGULAIR) 10 MG tablet Take 1 tablet (10 mg total) by mouth at bedtime. Patient not taking: Reported on 06/14/2020 05/27/19   Georgina QuintSagardia, Margrit Minner Jose, MD  pantoprazole (PROTONIX) 40 MG tablet Take 1 tablet (40 mg total) by mouth daily. Patient not taking: Reported on 06/14/2020 06/04/19   Tressia DanasBeavers, Kimberly, MD  predniSONE (STERAPRED UNI-PAK 21 TAB) 10 MG (21) TBPK tablet Take as directed Patient not taking: Reported on 06/14/2020 06/04/19   Cristie HemStanbery, Mary L, PA-C    No Known Allergies  Patient Active Problem List   Diagnosis Date Noted  . Gastroesophageal reflux disease with esophagitis 02/11/2019    Past Medical History:  Diagnosis Date  . Allergies     Past Surgical History:  Procedure Laterality Date  . None      Social History   Socioeconomic History  . Marital status: Married    Spouse name: Not on file  . Number of children: 0  . Years of education: Not on file  . Highest education level: Not on file  Occupational History  . Occupation: Delivery  Tobacco Use  . Smoking status: Never Smoker  . Smokeless tobacco: Never Used  Vaping  Use  . Vaping Use: Never used  Substance and Sexual Activity  . Alcohol use: Yes    Comment: Occassionlly   . Drug use: No    Comment: No marijuana  . Sexual activity: Yes  Other Topics Concern  . Not on file  Social History Narrative  . Not on file   Social Determinants of Health   Financial Resource Strain:   . Difficulty of Paying Living Expenses: Not on file  Food Insecurity:   . Worried About Programme researcher, broadcasting/film/videounning Out of Food in the Last Year: Not on file  . Ran Out of Food in the Last Year: Not on file  Transportation Needs:   . Lack of Transportation (Medical): Not on file  . Lack of Transportation (Non-Medical): Not on file  Physical Activity:   . Days of Exercise per Week: Not on file  . Minutes of Exercise per Session: Not on file  Stress:   . Feeling of Stress : Not on file  Social Connections:   . Frequency of Communication with Friends and Family: Not on file  . Frequency of Social Gatherings with Friends and Family: Not on file  . Attends Religious Services: Not on file  . Active Member of Clubs or Organizations: Not on file  . Attends BankerClub or Organization Meetings: Not on file  . Marital Status: Not on file  Intimate Partner Violence:   . Fear of Current or Ex-Partner:  Not on file  . Emotionally Abused: Not on file  . Physically Abused: Not on file  . Sexually Abused: Not on file    Family History  Problem Relation Age of Onset  . Diabetes Mother   . Diabetes Father   . Colon cancer Neg Hx   . Esophageal cancer Neg Hx   . Liver cancer Neg Hx   . Pancreatic cancer Neg Hx   . Rectal cancer Neg Hx   . Stomach cancer Neg Hx      Review of Systems  Constitutional: Negative.  Negative for chills and fever.  HENT: Negative.  Negative for congestion and sore throat.   Eyes: Negative.   Respiratory: Negative.  Negative for cough and shortness of breath.   Cardiovascular: Negative.  Negative for chest pain and palpitations.  Gastrointestinal: Negative.  Negative  for abdominal pain, blood in stool, diarrhea, melena, nausea and vomiting.  Genitourinary: Negative.  Negative for dysuria and hematuria.  Musculoskeletal: Negative.  Negative for back pain, myalgias and neck pain.  Skin: Negative.  Negative for rash.  Neurological: Negative.  Negative for dizziness and headaches.  All other systems reviewed and are negative.  Today's Vitals   06/14/20 0850  BP: 117/78  Pulse: 67  Resp: 16  Temp: 97.9 F (36.6 C)  TempSrc: Temporal  SpO2: 99%  Weight: 175 lb (79.4 kg)  Height: 5' 11.25" (1.81 m)   Body mass index is 24.24 kg/m.   Physical Exam Vitals reviewed.  Constitutional:      Appearance: Normal appearance.  HENT:     Head: Normocephalic.     Mouth/Throat:     Mouth: Mucous membranes are moist.     Pharynx: Oropharynx is clear.  Eyes:     Extraocular Movements: Extraocular movements intact.     Conjunctiva/sclera: Conjunctivae normal.     Pupils: Pupils are equal, round, and reactive to light.  Cardiovascular:     Rate and Rhythm: Normal rate and regular rhythm.     Pulses: Normal pulses.     Heart sounds: Normal heart sounds.  Pulmonary:     Effort: Pulmonary effort is normal.     Breath sounds: Normal breath sounds.  Abdominal:     General: Bowel sounds are normal. There is no distension.     Palpations: Abdomen is soft.     Tenderness: There is no abdominal tenderness.  Musculoskeletal:        General: Normal range of motion.     Cervical back: Normal range of motion and neck supple. No tenderness.     Right lower leg: No edema.     Left lower leg: No edema.  Lymphadenopathy:     Cervical: No cervical adenopathy.  Skin:    General: Skin is warm and dry.     Capillary Refill: Capillary refill takes less than 2 seconds.  Neurological:     General: No focal deficit present.     Mental Status: He is alert and oriented to person, place, and time.  Psychiatric:        Mood and Affect: Mood normal.        Behavior:  Behavior normal.      ASSESSMENT & PLAN: Allen [name] was seen today for annual exam.  Diagnoses and all orders for this visit:  Routine general medical examination at a health care facility  History of prediabetes -     Hemoglobin A1c  History of vitamin D deficiency -     VITAMIN D  25 Hydroxy (Vit-D Deficiency, Fractures)  Need for hepatitis C screening test -     Hepatitis C antibody screen  Screening for deficiency anemia -     CBC with Differential  Screening for lipoid disorders -     Lipid panel  Screening for endocrine, metabolic and immunity disorder -     Comprehensive metabolic panel    Patient Instructions       If you have lab work done today you will be contacted with your lab results within the next 2 weeks.  If you have not heard from [place] then please contact us. The fastest way to get your results is to register for My Chart.   IF you received an x-ray today, you will receive an invoice from [facility]. Please contact [facility] at [phone] with questions or concerns regarding your invoice.   IF you received labwork today, you will receive an invoice from [facility]. Please contact [facility] at [phone] with questions or concerns regarding your invoice.   Our billing staff will not be able to assist you with questions regarding bills from these companies.  You will be contacted with the lab results as soon as they are available. The fastest way to get your results is to activate your My Chart account. Instructions are located on the last page of this paperwork. If you have not heard from [place] regarding the results in 2 weeks, please contact this office.      Health Maintenance, Male Adopting a healthy lifestyle and getting preventive care are important in promoting health and wellness. Ask your health care provider about:  The right schedule for you to have regular tests and exams.  Things you can do on your own to prevent  diseases and keep yourself healthy. What should I know about diet, weight, and exercise? Eat a healthy diet   Eat a diet that includes plenty of vegetables, fruits, low-fat dairy products, and lean protein.  Do not eat a lot of foods that are high in solid fats, added sugars, or sodium. Maintain a healthy weight Body mass index (BMI) is a measurement that can be used to identify possible weight problems. It estimates body fat based on height and weight. Your health care provider can help determine your BMI and help you achieve or maintain a healthy weight. Get regular exercise Get regular exercise. This is one of the most important things you can do for your health. Most adults should:  Exercise for at least 150 minutes each week. The exercise should increase your heart rate and make you sweat (moderate-intensity exercise).  Do strengthening exercises at least twice a week. This is in addition to the moderate-intensity exercise.  Spend less time sitting. Even light physical activity can be beneficial. Watch cholesterol and blood lipids Have your blood tested for lipids and cholesterol at 37 years of age, then have this test every 5 years. You may need to have your cholesterol levels checked more often if:  Your lipid or cholesterol levels are high.  You are older than 37 years of age.  You are at high risk for heart disease. What should I know about cancer screening? Many types of cancers can be detected early and may often be prevented. Depending on your health history and family history, you may need to have cancer screening at various ages. This may include screening for:  Colorectal cancer.  Prostate cancer.  Skin cancer.  Lung cancer. What should I know about heart disease, diabetes,  and high blood pressure? Blood pressure and heart disease  High blood pressure causes heart disease and increases the risk of stroke. This is more likely to develop in people who have high  blood pressure readings, are of African descent, or are overweight.  Talk with your health care provider about your target blood pressure readings.  Have your blood pressure checked: ? Every 3-5 years if you are 74-72 years of age. ? Every year if you are 68 years old or older.  If you are between the ages of 19 and 70 and are a current or former smoker, ask your health care provider if you should have a one-time screening for abdominal aortic aneurysm (AAA). Diabetes Have regular diabetes screenings. This checks your fasting blood sugar level. Have the screening done:  Once every three years after age 64 if you are at a normal weight and have a low risk for diabetes.  More often and at a younger age if you are overweight or have a high risk for diabetes. What should I know about preventing infection? Hepatitis B If you have a higher risk for hepatitis B, you should be screened for this virus. Talk with your health care provider to find out if you are at risk for hepatitis B infection. Hepatitis C Blood testing is recommended for:  Everyone born from [birth year] through 1965.  Anyone with known risk factors for hepatitis C. Sexually transmitted infections (STIs)  You should be screened each year for STIs, including gonorrhea and chlamydia, if: ? You are sexually active and are younger than 37 years of age. ? You are older than 37 years of age and your health care provider tells you that you are at risk for this type of infection. ? Your sexual activity has changed since you were last screened, and you are at increased risk for chlamydia or gonorrhea. Ask your health care provider if you are at risk.  Ask your health care provider about whether you are at high risk for HIV. Your health care provider may recommend a prescription medicine to help prevent HIV infection. If you choose to take medicine to prevent HIV, you should first get tested for HIV. You should then be tested every 3 months for  as long as you are taking the medicine. Follow these instructions at home: Lifestyle  Do not use any products that contain nicotine or tobacco, such as cigarettes, e-cigarettes, and chewing tobacco. If you need help quitting, ask your health care provider.  Do not use street drugs.  Do not share needles.  Ask your health care provider for help if you need support or information about quitting drugs. Alcohol use  Do not drink alcohol if your health care provider tells you not to drink.  If you drink alcohol: ? Limit how much you have to 0-2 drinks a day. ? Be aware of how much alcohol is in your drink. In the [place] one drink equals one 12 oz bottle of beer (355 mL), one 5 oz glass of wine (148 mL), or one 1 oz glass of hard liquor (44 mL). General instructions  Schedule regular health, dental, and eye exams.  Stay current with your vaccines.  Tell your health care provider if: ? You often feel depressed. ? You have ever been abused or do not feel safe at home. Summary  Adopting a healthy lifestyle and getting preventive care are important in promoting health and wellness.  Follow your health care provider's instructions about healthy diet,  exercising, and getting tested or screened for diseases.  Follow your health care provider's instructions on monitoring your cholesterol and blood pressure. This information is not intended to replace advice given to you by your health care provider. Make sure you discuss any questions you have with your health care provider. Document Revised: 08/14/2018 Document Reviewed: 08/14/2018 Elsevier Patient Education  2020 Elsevier Inc.      Edwina Barth, MD [facility]

## 2020-06-14 NOTE — Patient Instructions (Addendum)
   If you have lab work done today you will be contacted with your lab results within the next 2 weeks.  If you have not heard from us then please contact us. The fastest way to get your results is to register for My Chart.   IF you received an x-ray today, you will receive an invoice from [facility]. Please contact [facility] at [phone] with questions or concerns regarding your invoice.   IF you received labwork today, you will receive an invoice from [facility]. Please contact [facility] at [phone] with questions or concerns regarding your invoice.   Our billing staff will not be able to assist you with questions regarding bills from these companies.  You will be contacted with the lab results as soon as they are available. The fastest way to get your results is to activate your My Chart account. Instructions are located on the last page of this paperwork. If you have not heard from us regarding the results in 2 weeks, please contact this office.      Health Maintenance, Male Adopting a healthy lifestyle and getting preventive care are important in promoting health and wellness. Ask your health care provider about:  The right schedule for you to have regular tests and exams.  Things you can do on your own to prevent diseases and keep yourself healthy. What should I know about diet, weight, and exercise? Eat a healthy diet   Eat a diet that includes plenty of vegetables, fruits, low-fat dairy products, and lean protein.  Do not eat a lot of foods that are high in solid fats, added sugars, or sodium. Maintain a healthy weight Body mass index (BMI) is a measurement that can be used to identify possible weight problems. It estimates body fat based on height and weight. Your health care provider can help determine your BMI and help you achieve or maintain a healthy weight. Get regular exercise Get regular exercise. This is one of the most important things you  can do for your health. Most adults should:  Exercise for at least 150 minutes each week. The exercise should increase your heart rate and make you sweat (moderate-intensity exercise).  Do strengthening exercises at least twice a week. This is in addition to the moderate-intensity exercise.  Spend less time sitting. Even light physical activity can be beneficial. Watch cholesterol and blood lipids Have your blood tested for lipids and cholesterol at 37 years of age, then have this test every 5 years. You may need to have your cholesterol levels checked more often if:  Your lipid or cholesterol levels are high.  You are older than 37 years of age.  You are at high risk for heart disease. What should I know about cancer screening? Many types of cancers can be detected early and may often be prevented. Depending on your health history and family history, you may need to have cancer screening at various ages. This may include screening for:  Colorectal cancer.  Prostate cancer.  Skin cancer.  Lung cancer. What should I know about heart disease, diabetes, and high blood pressure? Blood pressure and heart disease  High blood pressure causes heart disease and increases the risk of stroke. This is more likely to develop in people who have high blood pressure readings, are of African descent, or are overweight.  Talk with your health care provider about your target blood pressure readings.  Have your blood pressure checked: ? Every 3-5 years if you are 18-39   years of age. ? Every year if you are 40 years old or older.  If you are between the ages of 65 and 75 and are a current or former smoker, ask your health care provider if you should have a one-time screening for abdominal aortic aneurysm (AAA). Diabetes Have regular diabetes screenings. This checks your fasting blood sugar level. Have the screening done:  Once every three years after age 45 if you are at a normal weight and have  a low risk for diabetes.  More often and at a younger age if you are overweight or have a high risk for diabetes. What should I know about preventing infection? Hepatitis B If you have a higher risk for hepatitis B, you should be screened for this virus. Talk with your health care provider to find out if you are at risk for hepatitis B infection. Hepatitis C Blood testing is recommended for:  Everyone born from 1945 through 1965.  Anyone with known risk factors for hepatitis C. Sexually transmitted infections (STIs)  You should be screened each year for STIs, including gonorrhea and chlamydia, if: ? You are sexually active and are younger than 37 years of age. ? You are older than 37 years of age and your health care provider tells you that you are at risk for this type of infection. ? Your sexual activity has changed since you were last screened, and you are at increased risk for chlamydia or gonorrhea. Ask your health care provider if you are at risk.  Ask your health care provider about whether you are at high risk for HIV. Your health care provider may recommend a prescription medicine to help prevent HIV infection. If you choose to take medicine to prevent HIV, you should first get tested for HIV. You should then be tested every 3 months for as long as you are taking the medicine. Follow these instructions at home: Lifestyle  Do not use any products that contain nicotine or tobacco, such as cigarettes, e-cigarettes, and chewing tobacco. If you need help quitting, ask your health care provider.  Do not use street drugs.  Do not share needles.  Ask your health care provider for help if you need support or information about quitting drugs. Alcohol use  Do not drink alcohol if your health care provider tells you not to drink.  If you drink alcohol: ? Limit how much you have to 0-2 drinks a day. ? Be aware of how much alcohol is in your drink. In the [place] one drink equals one 12  oz bottle of beer (355 mL), one 5 oz glass of wine (148 mL), or one 1 oz glass of hard liquor (44 mL). General instructions  Schedule regular health, dental, and eye exams.  Stay current with your vaccines.  Tell your health care provider if: ? You often feel depressed. ? You have ever been abused or do not feel safe at home. Summary  Adopting a healthy lifestyle and getting preventive care are important in promoting health and wellness.  Follow your health care provider's instructions about healthy diet, exercising, and getting tested or screened for diseases.  Follow your health care provider's instructions on monitoring your cholesterol and blood pressure. This information is not intended to replace advice given to you by your health care provider. Make sure you discuss any questions you have with your health care provider. Document Revised: 08/14/2018 Document Reviewed: 08/14/2018 Elsevier Patient Education  2020 Elsevier Inc.  

## 2020-06-15 ENCOUNTER — Encounter: Payer: Self-pay | Admitting: Radiology

## 2020-06-15 LAB — COMPREHENSIVE METABOLIC PANEL
ALT: 24 IU/L (ref 0–44)
AST: 25 IU/L (ref 0–40)
Albumin/Globulin Ratio: 1.6 (ref 1.2–2.2)
Albumin: 4.6 g/dL (ref 4.0–5.0)
Alkaline Phosphatase: 62 IU/L (ref 44–121)
Bilirubin Total: 0.4 mg/dL (ref 0.0–1.2)
CO2: 26 mmol/L (ref 20–29)
Calcium: 9.5 mg/dL (ref 8.7–10.2)
Creatinine, Ser: 1.02 mg/dL (ref 0.76–1.27)
GFR calc Af Amer: 109 mL/min/{1.73_m2} (ref >59)
GFR calc non Af Amer: 94 mL/min/{1.73_m2} (ref >59)
Globulin, Total: 2.9 g/dL (ref 1.5–4.5)
Glucose: 97 mg/dL (ref 65–99)
Potassium: 4.3 mmol/L (ref 3.5–5.2)
Sodium: 141 mmol/L (ref 134–144)
Total Protein: 7.5 g/dL (ref 6.0–8.5)

## 2020-06-15 LAB — CBC WITH DIFFERENTIAL/PLATELET
Basophils Absolute: 0 10*3/uL (ref 0.0–0.2)
Basos: 1 %
EOS (ABSOLUTE): 0.1 10*3/uL (ref 0.0–0.4)
Eos: 3 %
Hematocrit: 47.1 % (ref 37.5–51.0)
Immature Grans (Abs): 0 10*3/uL (ref 0.0–0.1)
Immature Granulocytes: 0 %
Lymphocytes Absolute: 1.7 10*3/uL (ref 0.7–3.1)
Lymphs: 44 %
MCH: 29.3 pg (ref 26.6–33.0)
MCHC: 33.8 g/dL (ref 31.5–35.7)
MCV: 87 fL (ref 79–97)
Monocytes Absolute: 0.2 10*3/uL (ref 0.1–0.9)
Neutrophils Absolute: 1.6 10*3/uL (ref 1.4–7.0)
Platelets: 184 10*3/uL (ref 150–450)
RBC: 5.42 x10E6/uL (ref 4.14–5.80)
RDW: 13.2 % (ref 11.6–15.4)

## 2020-06-15 LAB — LIPID PANEL
Chol/HDL Ratio: 3.7 ratio (ref 0.0–5.0)
Cholesterol, Total: 290 mg/dL — ABNORMAL HIGH (ref 100–199)
LDL Chol Calc (NIH): 196 mg/dL — ABNORMAL HIGH (ref 0–99)
Triglycerides: 90 mg/dL (ref 0–149)
VLDL Cholesterol Cal: 15 mg/dL (ref 5–40)

## 2020-06-15 LAB — VITAMIN D 25 HYDROXY (VIT D DEFICIENCY, FRACTURES): Vit D, 25-Hydroxy: 30 ng/mL (ref 30.0–100.0)

## 2020-06-15 LAB — HEMOGLOBIN A1C
Est. average glucose Bld gHb Est-mCnc: 117 mg/dL
Hgb A1c MFr Bld: 5.7 % — ABNORMAL HIGH (ref 4.8–5.6)

## 2020-06-15 LAB — HEPATITIS C ANTIBODY: Hep C Virus Ab: 0.1 s/co ratio (ref 0.0–0.9)

## 2020-07-08 IMAGING — CT CT HEAD W/O CM
4 series · 17 of 47 positions shown, 19 images · non-contrast
Comparison: 09/20/2016 MRI head.

CLINICAL DATA: 36 y/o  M; acute headache.

EXAM:
CT HEAD WITHOUT CONTRAST
TECHNIQUE: Contiguous axial images were obtained from the base of the skull
through the vertex without intravenous contrast.

[Series 3: head wo · axial · 0.44mm/px · z∈[-75,+45]mm · 7 of 34 slices shown, 9 images]
[im 5/34  brain]
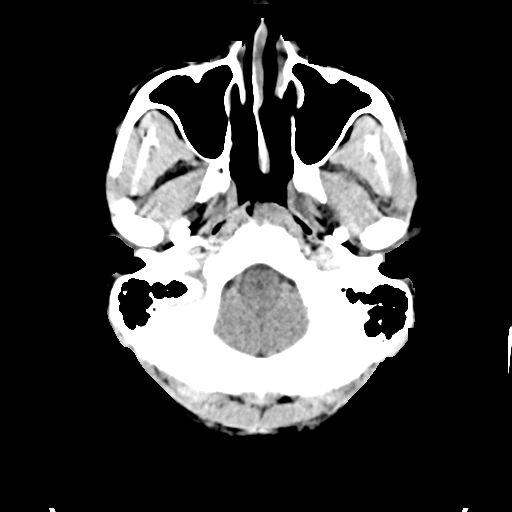
[im 5/34  bone]
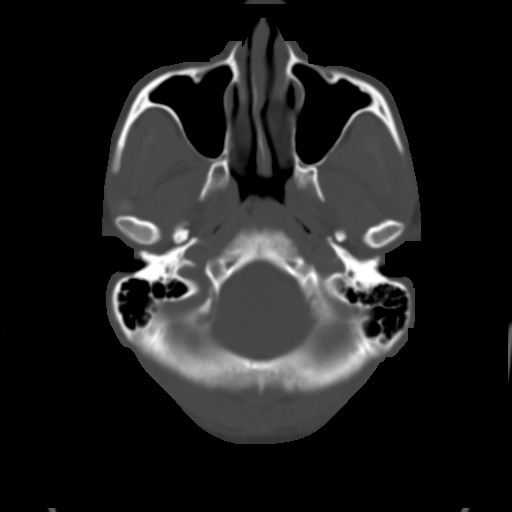
[im 9/34  brain]
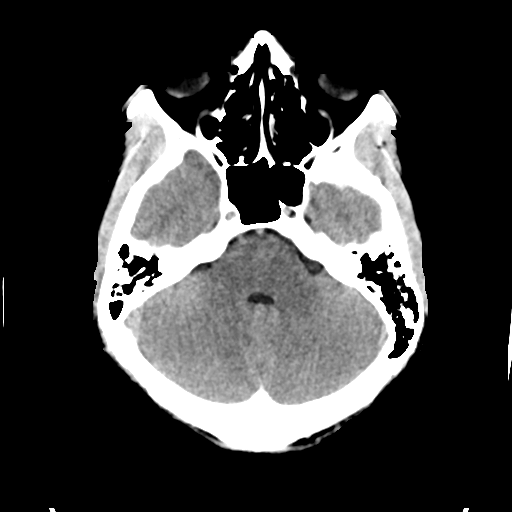
[im 13/34  brain]
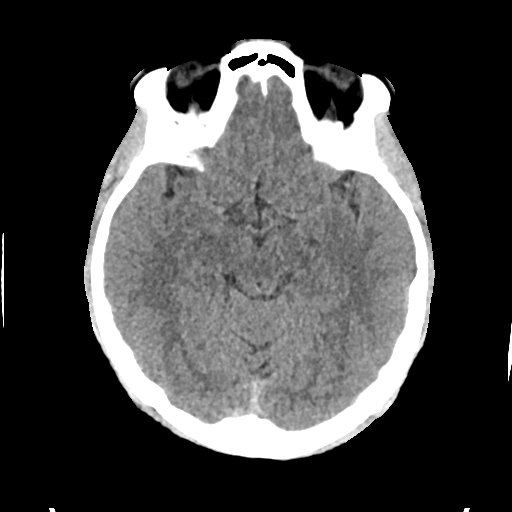
[im 17/34  brain]
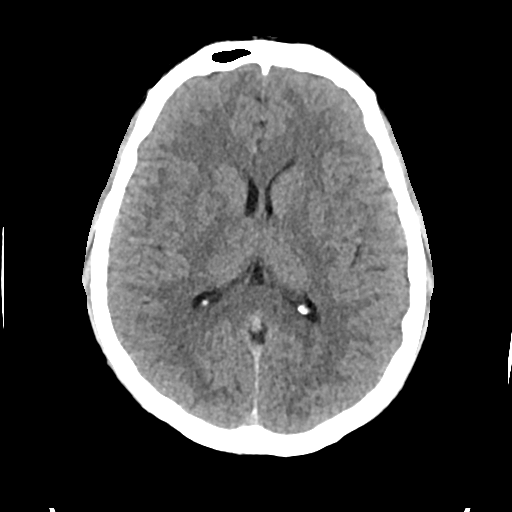
[im 21/34  brain]
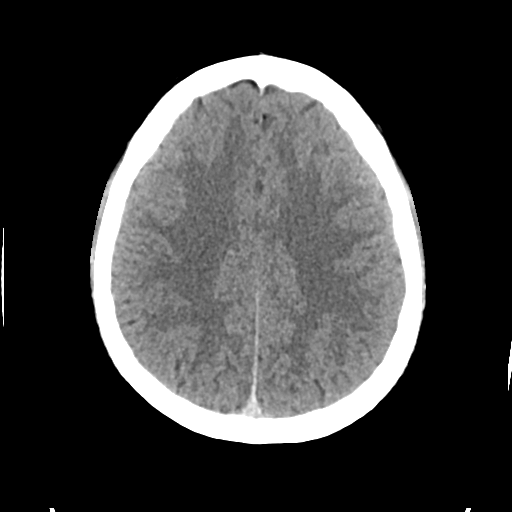
[im 21/34  bone]
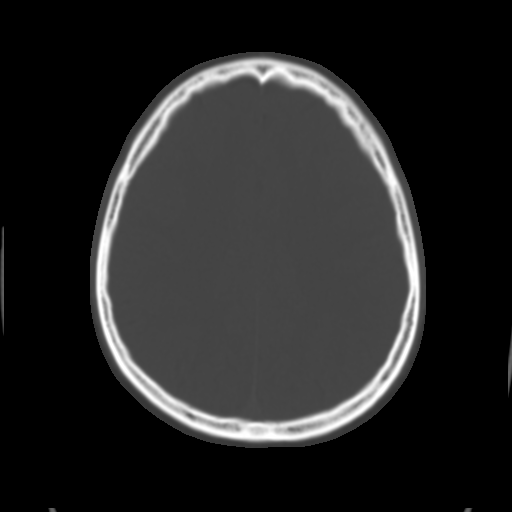
[im 25/34  brain]
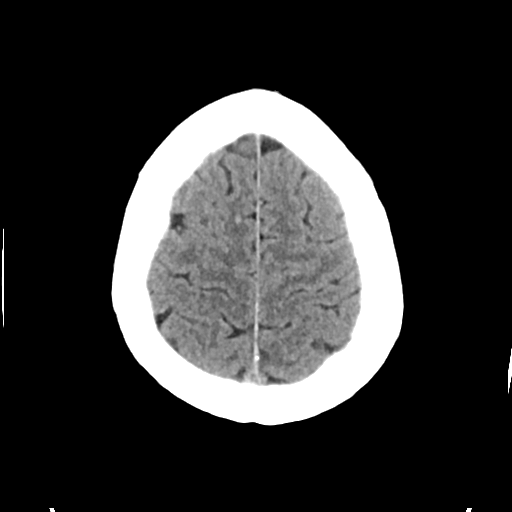
[im 29/34  brain]
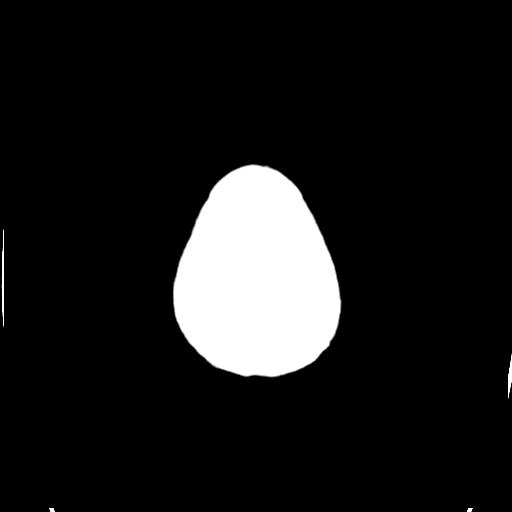

[Series 4: head bone · axial · 0.44mm/px · z∈[-79,-21]mm · 4 of 84 slices shown]
[im 9/84  bone]
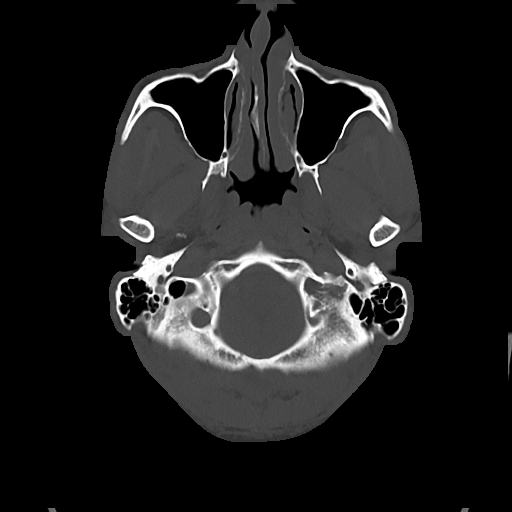
[im 17/84  bone]
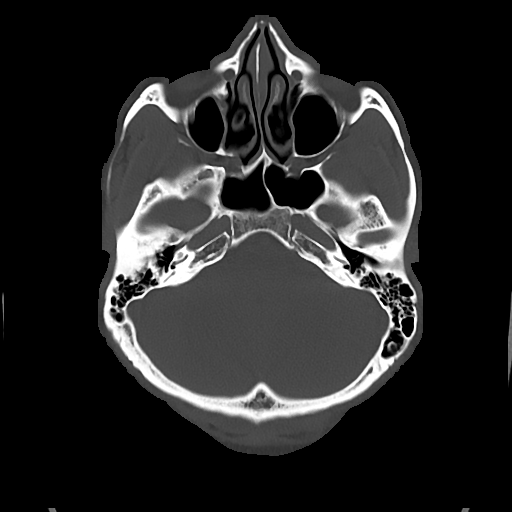
[im 25/84  bone]
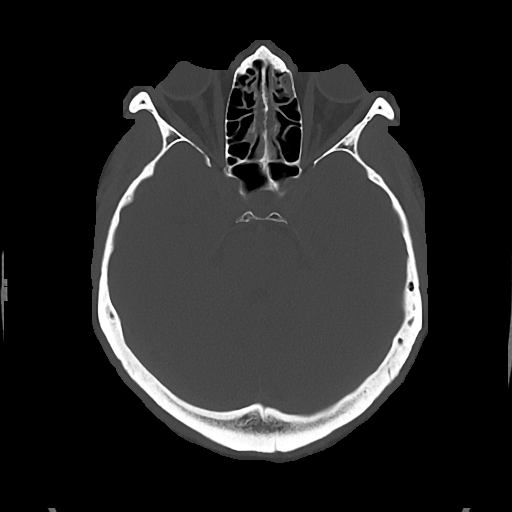
[im 38/84  bone]
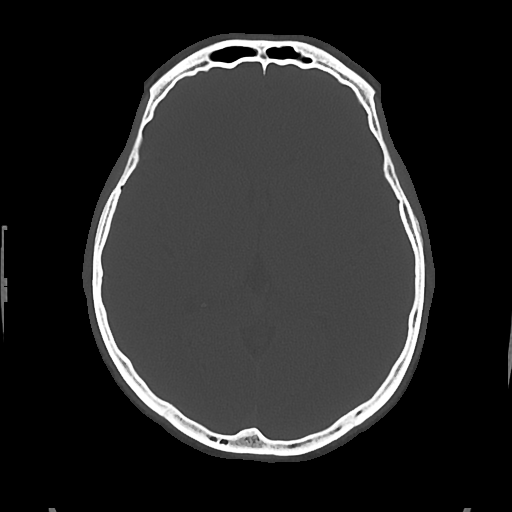

[Series 5: cor soft · coronal · 0.32mm/px · 3 of 70 slices shown]
[im 24/70  brain]
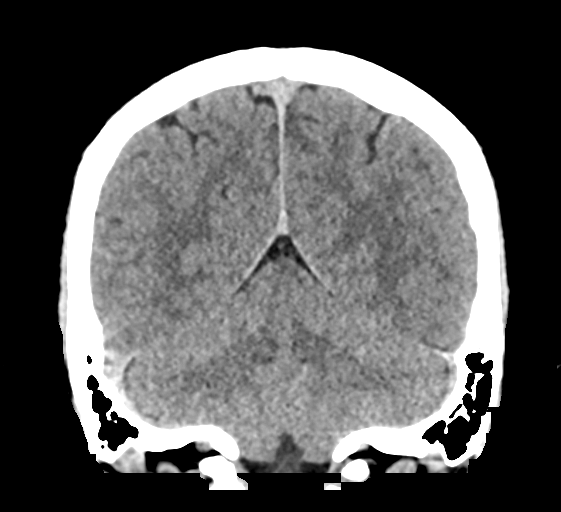
[im 31/70  brain]
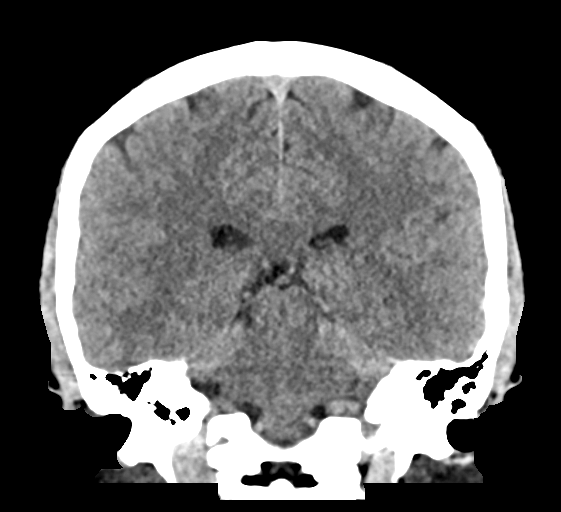
[im 39/70  brain]
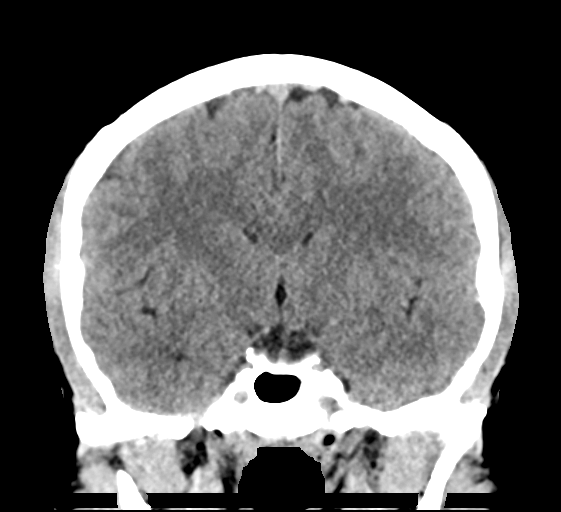

[Series 6: sag soft · sagittal · 0.32mm/px · 3 of 67 slices shown]
[im 23/67  brain]
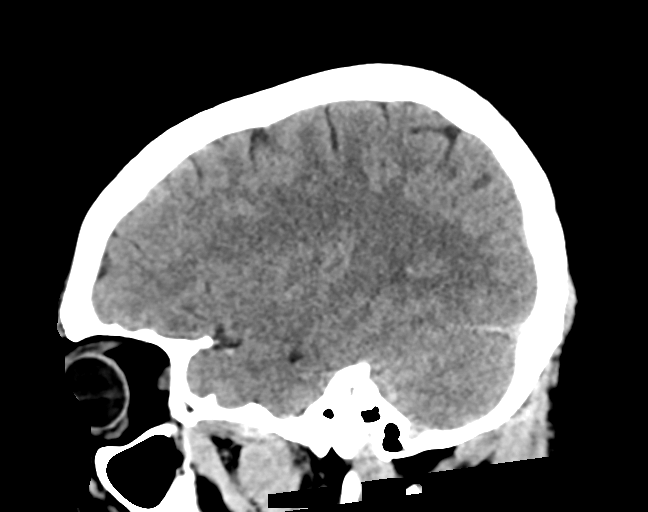
[im 34/67  brain]
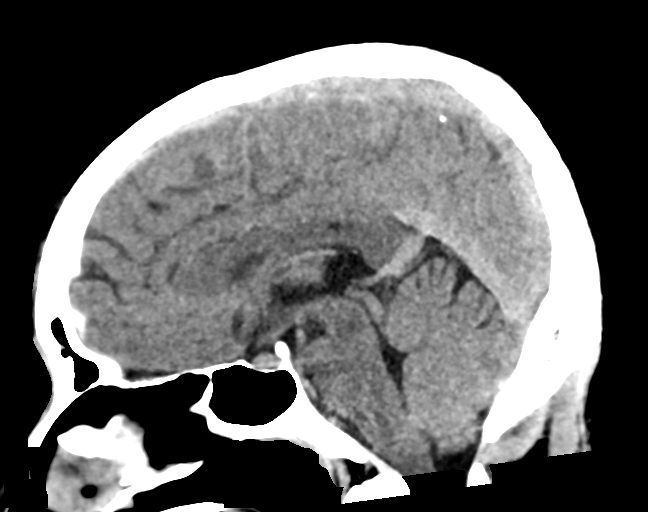
[im 45/67  brain]
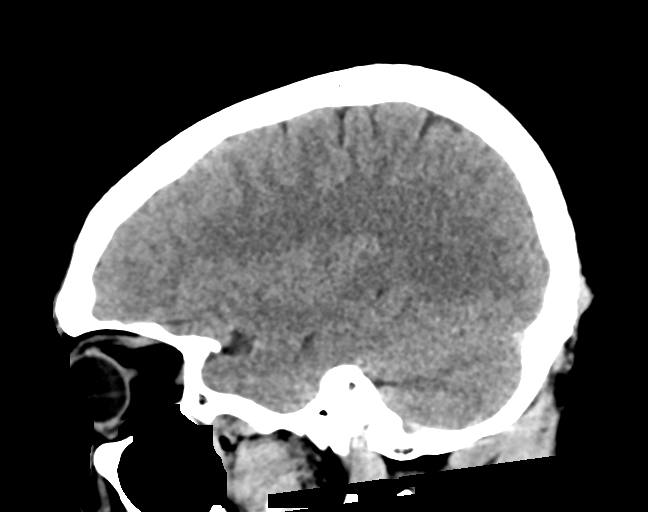

[17 of 47 positions shown; findings below may reference images not displayed]

FINDINGS: Brain: No evidence of acute infarction, hemorrhage, hydrocephalus,
extra-axial collection or mass lesion/mass effect.

Vascular: No hyperdense vessel or unexpected calcification.

Skull: Normal. Negative for fracture or focal lesion.

Sinuses/Orbits: No acute finding.

Other: None.
IMPRESSION: Normal head CT.

By: Safiollah Lage M.D.

## 2020-07-22 DIAGNOSIS — K080 Exfoliation of teeth due to systemic causes: Secondary | ICD-10-CM | POA: Diagnosis not present

## 2020-10-04 DIAGNOSIS — E782 Mixed hyperlipidemia: Secondary | ICD-10-CM | POA: Diagnosis not present

## 2020-10-04 DIAGNOSIS — Z113 Encounter for screening for infections with a predominantly sexual mode of transmission: Secondary | ICD-10-CM | POA: Diagnosis not present

## 2020-10-04 DIAGNOSIS — J301 Allergic rhinitis due to pollen: Secondary | ICD-10-CM | POA: Diagnosis not present

## 2020-10-04 DIAGNOSIS — Z202 Contact with and (suspected) exposure to infections with a predominantly sexual mode of transmission: Secondary | ICD-10-CM | POA: Diagnosis not present

## 2020-10-05 DIAGNOSIS — A5401 Gonococcal cystitis and urethritis, unspecified: Secondary | ICD-10-CM | POA: Diagnosis not present

## 2020-10-11 ENCOUNTER — Other Ambulatory Visit: Payer: Self-pay

## 2020-10-11 ENCOUNTER — Ambulatory Visit: Payer: Federal, State, Local not specified - PPO | Admitting: Emergency Medicine

## 2020-10-11 ENCOUNTER — Encounter: Payer: Self-pay | Admitting: Emergency Medicine

## 2020-10-11 VITALS — BP 110/73 | HR 71 | Temp 99.1°F | Resp 16 | Ht 69.0 in | Wt 172.0 lb

## 2020-10-11 DIAGNOSIS — R399 Unspecified symptoms and signs involving the genitourinary system: Secondary | ICD-10-CM | POA: Diagnosis not present

## 2020-10-11 DIAGNOSIS — E785 Hyperlipidemia, unspecified: Secondary | ICD-10-CM | POA: Diagnosis not present

## 2020-10-11 DIAGNOSIS — R202 Paresthesia of skin: Secondary | ICD-10-CM

## 2020-10-11 LAB — LIPID PANEL

## 2020-10-11 LAB — HEMOGLOBIN A1C

## 2020-10-11 LAB — COMPREHENSIVE METABOLIC PANEL
GFR calc Af Amer: 109 mL/min/{1.73_m2} (ref >59)
Globulin, Total: 2.6 g/dL (ref 1.5–4.5)

## 2020-10-11 NOTE — Progress Notes (Signed)
Allen [name] 39 y.o.   Chief Complaint  Patient presents with  . Hyperlipidemia    Per patient to check cholesterol   . Hand Pain    Per patient finger pain on tip of left fingers for 1-2 weeks    HISTORY OF PRESENT ILLNESS: This is a 40 y.o. male here for follow-up on dyslipidemia.  Abnormal lipid profile last October.  Was not started on medication then. Also complaining of 3 to 4-week history of intermittent pain to the of left index finger. No other complaints or medical concerns today. Fully vaccinated against COVID with a booster.   HPI   Prior to Admission medications   Medication Sig Start Date End Date Taking? Authorizing Provider  Multiple Vitamin (MULTIVITAMIN) tablet Take 1 tablet by mouth as needed.   Yes [provider]  VITAMIN D PO Take by mouth daily.   Yes [provider]  fexofenadine (ALLEGRA) 180 MG tablet Take 180 mg by mouth daily. Patient not taking: Reported on 10/11/2020    [provider]  montelukast (SINGULAIR) 10 MG tablet Take 1 tablet (10 mg total) by mouth at bedtime. Patient not taking: Reported on 10/11/2020 05/27/19   Georgina Quint, MD  pantoprazole (PROTONIX) 40 MG tablet Take 1 tablet (40 mg total) by mouth daily. Patient not taking: Reported on 10/11/2020 06/04/19   Tressia Danas, MD  predniSONE (STERAPRED UNI-PAK 21 TAB) 10 MG (21) TBPK tablet Take as directed Patient not taking: Reported on 10/11/2020 06/04/19   Cristie Hem, PA-C    No Known Allergies  Patient Active Problem List   Diagnosis Date Noted  . Gastroesophageal reflux disease with esophagitis 02/11/2019    Past Medical History:  Diagnosis Date  . Allergies     Past Surgical History:  Procedure Laterality Date  . None      Social History   Socioeconomic History  . Marital status: Married    Spouse name: Not on file  . Number of children: 0  . Years of education: Not on file  . Highest education level: Not on file   Occupational History  . Occupation: Delivery  Tobacco Use  . Smoking status: Never Smoker  . Smokeless tobacco: Never Used  Vaping Use  . Vaping Use: Never used  Substance and Sexual Activity  . Alcohol use: Yes    Comment: Occassionlly   . Drug use: No    Comment: No marijuana  . Sexual activity: Yes  Other Topics Concern  . Not on file  Social History Narrative  . Not on file   Social Determinants of Health   Financial Resource Strain: Not on file  Food Insecurity: Not on file  Transportation Needs: Not on file  Physical Activity: Not on file  Stress: Not on file  Social Connections: Not on file  Intimate Partner Violence: Not on file    Family History  Problem Relation Age of Onset  . Diabetes Mother   . High Cholesterol Mother   . Diabetes Father   . High Cholesterol Father   . Colon cancer Neg Hx   . Esophageal cancer Neg Hx   . Liver cancer Neg Hx   . Pancreatic cancer Neg Hx   . Rectal cancer Neg Hx   . Stomach cancer Neg Hx      Review of Systems  Constitutional: Negative.  Negative for chills and fever.  HENT: Negative.  Negative for congestion and sore throat.   Respiratory: Negative.  Negative for cough  and shortness of breath.   Cardiovascular: Negative.  Negative for chest pain and palpitations.  Gastrointestinal: Negative.  Negative for abdominal pain, diarrhea, nausea and vomiting.  Genitourinary: Positive for frequency and urgency. Negative for dysuria, flank pain and hematuria.  Musculoskeletal: Negative.  Negative for back pain, myalgias and neck pain.  Skin: Negative.  Negative for rash.  Neurological: Negative.  Negative for dizziness and headaches.  All other systems reviewed and are negative.    Today's Vitals   10/11/20 0940  BP: 110/73  Pulse: 71  Resp: 16  Temp: 99.1 F (37.3 C)  TempSrc: Temporal  SpO2: 98%  Weight: 172 lb (78 kg)  Height: 5\' 9"  (1.753 m)   Body mass index is 25.4 kg/m.   Physical Exam Vitals  reviewed.  Constitutional:      Appearance: Normal appearance.  HENT:     Head: Normocephalic.  Eyes:     Extraocular Movements: Extraocular movements intact.     Conjunctiva/sclera: Conjunctivae normal.     Pupils: Pupils are equal, round, and reactive to light.  Cardiovascular:     Rate and Rhythm: Regular rhythm.     Pulses: Normal pulses.     Heart sounds: Normal heart sounds.  Pulmonary:     Effort: Pulmonary effort is normal.     Breath sounds: Normal breath sounds.  Musculoskeletal:     Cervical back: Normal range of motion and neck supple.  Skin:    General: Skin is warm and dry.     Capillary Refill: Capillary refill takes less than 2 seconds.  Neurological:     General: No focal deficit present.     Mental Status: He is alert and oriented to person, place, and time.  Psychiatric:        Mood and Affect: Mood normal.        Behavior: Behavior normal.      ASSESSMENT & PLAN: Allen [name] was seen today for hyperlipidemia and hand pain.  Diagnoses and all orders for this visit:  Dyslipidemia -     Comprehensive metabolic panel -     Lipid panel -     Hemoglobin A1c  Lower urinary tract symptoms -     Ambulatory referral to Urology  Paresthesia of finger    Patient Instructions       If you have lab work done today you will be contacted with your lab results within the next 2 weeks.  If you have not heard from [place] then please contact us. The fastest way to get your results is to register for My Chart.   IF you received an x-ray today, you will receive an invoice from [facility]. Please contact [facility] at [phone] with questions or concerns regarding your invoice.   IF you received labwork today, you will receive an invoice from [facility]. Please contact [facility] at [phone] with questions or concerns regarding your invoice.   Our billing staff will not be able to assist you with questions regarding bills from these  companies.  You will be contacted with the lab results as soon as they are available. The fastest way to get your results is to activate your My Chart account. Instructions are located on the last page of this paperwork. If you have not heard from [place] regarding the results in 2 weeks, please contact this office.     Dyslipidemia Dyslipidemia is an imbalance of waxy, fat-like substances (lipids) in the blood. The body needs lipids in small amounts. Dyslipidemia often  involves a high level of cholesterol or triglycerides, which are types of lipids. Common forms of dyslipidemia include:  High levels of LDL cholesterol. LDL is the type of cholesterol that causes fatty deposits (plaques) to build up in the blood vessels that carry blood away from your heart (arteries).  Low levels of HDL cholesterol. HDL cholesterol is the type of cholesterol that protects against heart disease. High levels of HDL remove the LDL buildup from arteries.  High levels of triglycerides. Triglycerides are a fatty substance in the blood that is linked to a buildup of plaques in the arteries. What are the causes? Primary dyslipidemia is caused by changes (mutations) in genes that are passed down through families (inherited). These mutations cause several types of dyslipidemia. Secondary dyslipidemia is caused by lifestyle choices and diseases that lead to dyslipidemia, such as:  Eating a diet that is high in animal fat.  Not getting enough exercise.  Having diabetes, kidney disease, liver disease, or thyroid disease.  Drinking large amounts of alcohol.  Using certain medicines. What increases the risk? You are more likely to develop this condition if you are an older man or if you are a woman who has gone through menopause. Other risk factors include:  Having a family history of dyslipidemia.  Taking certain medicines, including birth control pills, steroids, some diuretics, and beta-blockers.  Smoking  cigarettes.  Eating a high-fat diet.  Having certain medical conditions such as diabetes, polycystic ovary syndrome (PCOS), kidney disease, liver disease, or hypothyroidism.  Not exercising regularly.  Being overweight or obese with too much belly fat. What are the signs or symptoms? In most cases, dyslipidemia does not usually cause any symptoms. In severe cases, very high lipid levels can cause:  Fatty bumps under the skin (xanthomas).  White or gray ring around the black center (pupil) of the eye. Very high triglyceride levels can cause inflammation of the pancreas (pancreatitis). How is this diagnosed? Your health care provider may diagnose dyslipidemia based on a routine blood test (fasting blood test). Because most people do not have symptoms of the condition, this blood testing (lipid profile) is done on adults age 39 and older and is repeated every 5 years. This test checks:  Total cholesterol. This measures the total amount of cholesterol in your blood, including LDL cholesterol, HDL cholesterol, and triglycerides. A healthy number is below 200.  LDL cholesterol. The target number for LDL cholesterol is different for each person, depending on individual risk factors. Ask your health care provider what your LDL cholesterol should be.  HDL cholesterol. An HDL level of 60 or higher is best because it helps to protect against heart disease. A number below 40 for men or below 50 for women increases the risk for heart disease.  Triglycerides. A healthy triglyceride number is below 150. If your lipid profile is abnormal, your health care provider may do other blood tests.   How is this treated? Treatment depends on the type of dyslipidemia that you have and your other risk factors for heart disease and stroke. Your health care provider will have a target range for your lipid levels based on this information. For many people, this condition may be treated by lifestyle changes, such as  diet and exercise. Your health care provider may recommend that you:  Get regular exercise.  Make changes to your diet.  Quit smoking if you smoke. If diet changes and exercise do not help you reach your goals, your health care provider may also  prescribe medicine to lower lipids. The most commonly prescribed type of medicine lowers your LDL cholesterol (statin drug). If you have a high triglyceride level, your provider may prescribe another type of drug (fibrate) or an omega-3 fish oil supplement, or both. Follow these instructions at home: Eating and drinking  Follow instructions from your health care provider or dietitian about eating or drinking restrictions.  Eat a healthy diet as told by your health care provider. This can help you reach and maintain a healthy weight, lower your LDL cholesterol, and raise your HDL cholesterol. This may include: ? Limiting your calories, if you are overweight. ? Eating more fruits, vegetables, whole grains, fish, and lean meats. ? Limiting saturated fat, trans fat, and cholesterol.  If you drink alcohol: ? Limit how much you use. ? Be aware of how much alcohol is in your drink. In the [place] one drink equals one 12 oz bottle of beer (355 mL), one 5 oz glass of wine (148 mL), or one 1 oz glass of hard liquor (44 mL).  Do not drink alcohol if: ? Your health care provider tells you not to drink. ? You are pregnant, may be pregnant, or are planning to become pregnant. Activity  Get regular exercise. Start an exercise and strength training program as told by your health care provider. Ask your health care provider what activities are safe for you. Your health care provider may recommend: ? 30 minutes of aerobic activity 4-6 days a week. Brisk walking is an example of aerobic activity. ? Strength training 2 days a week. General instructions  Do not use any products that contain nicotine or tobacco, such as cigarettes, e-cigarettes, and chewing tobacco.  If you need help quitting, ask your health care provider.  Take over-the-counter and prescription medicines only as told by your health care provider. This includes supplements.  Keep all follow-up visits as told by your health care provider.   Contact a health care provider if:  You are: ? Having trouble sticking to your exercise or diet plan. ? Struggling to quit smoking or control your use of alcohol. Summary  Dyslipidemia often involves a high level of cholesterol or triglycerides, which are types of lipids.  Treatment depends on the type of dyslipidemia that you have and your other risk factors for heart disease and stroke.  For many people, treatment starts with lifestyle changes, such as diet and exercise.  Your health care provider may prescribe medicine to lower lipids. This information is not intended to replace advice given to you by your health care provider. Make sure you discuss any questions you have with your health care provider. Document Revised: 04/15/2018 Document Reviewed: 03/22/2018 Elsevier Patient Education  2021 Elsevier Inc.      Edwina Barth, MD [facility]

## 2020-10-11 NOTE — Patient Instructions (Addendum)
   If you have lab work done today you will be contacted with your lab results within the next 2 weeks.  If you have not heard from us then please contact us. The fastest way to get your results is to register for My Chart.   IF you received an x-ray today, you will receive an invoice from [facility]. Please contact [facility] at [phone] with questions or concerns regarding your invoice.   IF you received labwork today, you will receive an invoice from [facility]. Please contact [facility] at [phone] with questions or concerns regarding your invoice.   Our billing staff will not be able to assist you with questions regarding bills from these companies.  You will be contacted with the lab results as soon as they are available. The fastest way to get your results is to activate your My Chart account. Instructions are located on the last page of this paperwork. If you have not heard from us regarding the results in 2 weeks, please contact this office.     Dyslipidemia Dyslipidemia is an imbalance of waxy, fat-like substances (lipids) in the blood. The body needs lipids in small amounts. Dyslipidemia often involves a high level of cholesterol or triglycerides, which are types of lipids. Common forms of dyslipidemia include:  High levels of LDL cholesterol. LDL is the type of cholesterol that causes fatty deposits (plaques) to build up in the blood vessels that carry blood away from your heart (arteries).  Low levels of HDL cholesterol. HDL cholesterol is the type of cholesterol that protects against heart disease. High levels of HDL remove the LDL buildup from arteries.  High levels of triglycerides. Triglycerides are a fatty substance in the blood that is linked to a buildup of plaques in the arteries. What are the causes? Primary dyslipidemia is caused by changes (mutations) in genes that are passed down through families (inherited). These mutations cause several  types of dyslipidemia. Secondary dyslipidemia is caused by lifestyle choices and diseases that lead to dyslipidemia, such as:  Eating a diet that is high in animal fat.  Not getting enough exercise.  Having diabetes, kidney disease, liver disease, or thyroid disease.  Drinking large amounts of alcohol.  Using certain medicines. What increases the risk? You are more likely to develop this condition if you are an older man or if you are a woman who has gone through menopause. Other risk factors include:  Having a family history of dyslipidemia.  Taking certain medicines, including birth control pills, steroids, some diuretics, and beta-blockers.  Smoking cigarettes.  Eating a high-fat diet.  Having certain medical conditions such as diabetes, polycystic ovary syndrome (PCOS), kidney disease, liver disease, or hypothyroidism.  Not exercising regularly.  Being overweight or obese with too much belly fat. What are the signs or symptoms? In most cases, dyslipidemia does not usually cause any symptoms. In severe cases, very high lipid levels can cause:  Fatty bumps under the skin (xanthomas).  White or gray ring around the black center (pupil) of the eye. Very high triglyceride levels can cause inflammation of the pancreas (pancreatitis). How is this diagnosed? Your health care provider may diagnose dyslipidemia based on a routine blood test (fasting blood test). Because most people do not have symptoms of the condition, this blood testing (lipid profile) is done on adults age 20 and older and is repeated every 5 years. This test checks:  Total cholesterol. This measures the total amount of cholesterol in your blood, including LDL   cholesterol, HDL cholesterol, and triglycerides. A healthy number is below 200.  LDL cholesterol. The target number for LDL cholesterol is different for each person, depending on individual risk factors. Ask your health care provider what your LDL  cholesterol should be.  HDL cholesterol. An HDL level of 60 or higher is best because it helps to protect against heart disease. A number below 40 for men or below 50 for women increases the risk for heart disease.  Triglycerides. A healthy triglyceride number is below 150. If your lipid profile is abnormal, your health care provider may do other blood tests.   How is this treated? Treatment depends on the type of dyslipidemia that you have and your other risk factors for heart disease and stroke. Your health care provider will have a target range for your lipid levels based on this information. For many people, this condition may be treated by lifestyle changes, such as diet and exercise. Your health care provider may recommend that you:  Get regular exercise.  Make changes to your diet.  Quit smoking if you smoke. If diet changes and exercise do not help you reach your goals, your health care provider may also prescribe medicine to lower lipids. The most commonly prescribed type of medicine lowers your LDL cholesterol (statin drug). If you have a high triglyceride level, your provider may prescribe another type of drug (fibrate) or an omega-3 fish oil supplement, or both. Follow these instructions at home: Eating and drinking  Follow instructions from your health care provider or dietitian about eating or drinking restrictions.  Eat a healthy diet as told by your health care provider. This can help you reach and maintain a healthy weight, lower your LDL cholesterol, and raise your HDL cholesterol. This may include: ? Limiting your calories, if you are overweight. ? Eating more fruits, vegetables, whole grains, fish, and lean meats. ? Limiting saturated fat, trans fat, and cholesterol.  If you drink alcohol: ? Limit how much you use. ? Be aware of how much alcohol is in your drink. In the [place] one drink equals one 12 oz bottle of beer (355 mL), one 5 oz glass of wine (148 mL), or one 1  oz glass of hard liquor (44 mL).  Do not drink alcohol if: ? Your health care provider tells you not to drink. ? You are pregnant, may be pregnant, or are planning to become pregnant. Activity  Get regular exercise. Start an exercise and strength training program as told by your health care provider. Ask your health care provider what activities are safe for you. Your health care provider may recommend: ? 30 minutes of aerobic activity 4-6 days a week. Brisk walking is an example of aerobic activity. ? Strength training 2 days a week. General instructions  Do not use any products that contain nicotine or tobacco, such as cigarettes, e-cigarettes, and chewing tobacco. If you need help quitting, ask your health care provider.  Take over-the-counter and prescription medicines only as told by your health care provider. This includes supplements.  Keep all follow-up visits as told by your health care provider.   Contact a health care provider if:  You are: ? Having trouble sticking to your exercise or diet plan. ? Struggling to quit smoking or control your use of alcohol. Summary  Dyslipidemia often involves a high level of cholesterol or triglycerides, which are types of lipids.  Treatment depends on the type of dyslipidemia that you have and your other risk factors for   heart disease and stroke.  For many people, treatment starts with lifestyle changes, such as diet and exercise.  Your health care provider may prescribe medicine to lower lipids. This information is not intended to replace advice given to you by your health care provider. Make sure you discuss any questions you have with your health care provider. Document Revised: 04/15/2018 Document Reviewed: 03/22/2018 Elsevier Patient Education  [place].

## 2020-10-12 ENCOUNTER — Other Ambulatory Visit: Payer: Self-pay | Admitting: Emergency Medicine

## 2020-10-12 ENCOUNTER — Encounter: Payer: Self-pay | Admitting: Emergency Medicine

## 2020-10-12 DIAGNOSIS — E785 Hyperlipidemia, unspecified: Secondary | ICD-10-CM

## 2020-10-12 LAB — COMPREHENSIVE METABOLIC PANEL
ALT: 21 IU/L (ref 0–44)
AST: 26 IU/L (ref 0–40)
Albumin/Globulin Ratio: 1.8 (ref 1.2–2.2)
Albumin: 4.6 g/dL (ref 4.0–5.0)
Alkaline Phosphatase: 64 IU/L (ref 44–121)
BUN/Creatinine Ratio: 12 (ref 9–20)
BUN: 12 mg/dL (ref 6–20)
Bilirubin Total: 0.6 mg/dL (ref 0.0–1.2)
CO2: 25 mmol/L (ref 20–29)
Calcium: 9.5 mg/dL (ref 8.7–10.2)
Chloride: 102 mmol/L (ref 96–106)
Creatinine, Ser: 1.01 mg/dL (ref 0.76–1.27)
GFR calc non Af Amer: 95 mL/min/{1.73_m2} (ref >59)
Glucose: 93 mg/dL (ref 65–99)
Potassium: 4.4 mmol/L (ref 3.5–5.2)
Sodium: 141 mmol/L (ref 134–144)
Total Protein: 7.2 g/dL (ref 6.0–8.5)

## 2020-10-12 LAB — HEMOGLOBIN A1C: Hgb A1c MFr Bld: 5.9 % — ABNORMAL HIGH (ref 4.8–5.6)

## 2020-10-12 LAB — LIPID PANEL
HDL: 74 mg/dL (ref >39)
LDL Chol Calc (NIH): 157 mg/dL — ABNORMAL HIGH (ref 0–99)
Triglycerides: 90 mg/dL (ref 0–149)
VLDL Cholesterol Cal: 15 mg/dL (ref 5–40)

## 2020-10-12 MED ORDER — ROSUVASTATIN CALCIUM 10 MG PO TABS: 10.0000 mg | ORAL_TABLET | Freq: Every day | ORAL | 3 refills | Status: DC

## 2020-10-13 NOTE — Progress Notes (Signed)
Pt saw labs

## 2020-10-20 DIAGNOSIS — L309 Dermatitis, unspecified: Secondary | ICD-10-CM | POA: Diagnosis not present

## 2020-10-20 DIAGNOSIS — L853 Xerosis cutis: Secondary | ICD-10-CM | POA: Diagnosis not present

## 2020-10-29 DIAGNOSIS — R3915 Urgency of urination: Secondary | ICD-10-CM | POA: Diagnosis not present

## 2020-10-29 DIAGNOSIS — R350 Frequency of micturition: Secondary | ICD-10-CM | POA: Diagnosis not present

## 2020-10-29 DIAGNOSIS — N503 Cyst of epididymis: Secondary | ICD-10-CM | POA: Diagnosis not present

## 2020-10-29 DIAGNOSIS — N5201 Erectile dysfunction due to arterial insufficiency: Secondary | ICD-10-CM | POA: Diagnosis not present

## 2020-11-03 ENCOUNTER — Encounter: Payer: Self-pay | Admitting: Student

## 2020-11-03 ENCOUNTER — Ambulatory Visit: Payer: Federal, State, Local not specified - PPO | Admitting: Student

## 2020-11-03 ENCOUNTER — Other Ambulatory Visit: Payer: Self-pay

## 2020-11-03 VITALS — BP 114/74 | HR 69 | Temp 97.1°F | Resp 16 | Ht 69.0 in | Wt 171.8 lb

## 2020-11-03 DIAGNOSIS — E7800 Pure hypercholesterolemia, unspecified: Secondary | ICD-10-CM

## 2020-11-03 DIAGNOSIS — R0789 Other chest pain: Secondary | ICD-10-CM

## 2020-11-03 DIAGNOSIS — R072 Precordial pain: Secondary | ICD-10-CM | POA: Diagnosis not present

## 2020-11-03 NOTE — Progress Notes (Signed)
Primary Physician:  Horald Pollen, MD   Patient ID: Allen [name], male    DOB: [birth year], 40 y.o.   MRN: [id number]  Subjective:    Chief Complaint  Patient presents with  . Chest Pain    When stretching    HPI: Allen [name]  is a 40 y.o. male  with  Hyperlipidemia last seen in 2018 for musculoskeletal chest pain and abnormal EKG, underwent echocardiogram that revealed moderate LVH, normal LVEF. Treadmill stress test in 2018 was considered low risk study.  Patient was last seen in our office by Dr. Einar Gip 05/05/2019 for musculoskeletal chest pain and recommended for as needed follow up. He now presents with complaint of "chest pain while stretching".  Patient reports intermittent chest pain over the last 1-2 weeks.  Describes the pain as an ache lasting approximately 1 minute both with exertion and at rest.  He denies associated symptoms.  Denies dyspnea, palpitations, leg edema, dizziness, lightheadedness, syncope, near syncope.  Patient also reports tenderness to palpation along his sternum and left chest wall.  Notably patient is a male [employer] and has had no issues working over the last 2 weeks.  He also runs on the treadmill regularly, he has had no chest pain symptoms while on the treadmill.  Patient has made significant diet and lifestyle modifications as of late to reduce his cardiovascular risk.  There is no family history of sudden cardiac death or premature coronary artery disease.   Past Medical History:  Diagnosis Date  . Allergies     Past Surgical History:  Procedure Laterality Date  . None     Social History   Tobacco Use  . Smoking status: Never Smoker  . Smokeless tobacco: Never Used  Substance Use Topics  . Alcohol use: Yes    Comment: Occassionlly    Marital Status: Married  ROS:   Review of Systems  Constitutional: Negative for decreased appetite, malaise/fatigue, weight gain and weight loss.  Eyes: Negative for visual disturbance.   Cardiovascular: Positive for chest pain. Negative for claudication, dyspnea on exertion, leg swelling, near-syncope, orthopnea, palpitations, paroxysmal nocturnal dyspnea and syncope.  Respiratory: Negative for hemoptysis and wheezing.   Endocrine: Negative for cold intolerance and heat intolerance.  Hematologic/Lymphatic: Does not bruise/bleed easily.  Skin: Negative for nail changes.  Musculoskeletal: Negative for muscle weakness and myalgias.  Gastrointestinal: Positive for heartburn. Negative for abdominal pain, change in bowel habit, melena, nausea and vomiting.  Neurological: Negative for difficulty with concentration, dizziness, focal weakness, headaches and weakness.  Psychiatric/Behavioral: Negative for altered mental status and suicidal ideas.  All other systems reviewed and are negative.  Objective:  Blood pressure 114/74, pulse 69, temperature (!) 97.1 F (36.2 C), temperature source Temporal, resp. rate 16, height _0  (1.753 m), weight 171 lb 12.8 oz (77.9 kg), SpO2 99 %. Body mass index is 25.37 kg/m.    Physical Exam Vitals reviewed.  Constitutional:      Appearance: He is well-developed.  HENT:     Head: Normocephalic and atraumatic.  Cardiovascular:     Rate and Rhythm: Normal rate and regular rhythm.     Pulses: Intact distal pulses.     Heart sounds: Normal heart sounds, S1 normal and S2 normal. No murmur heard. No gallop.   Pulmonary:     Effort: Pulmonary effort is normal. No accessory muscle usage or respiratory distress.     Breath sounds: Normal breath sounds. No wheezing, rhonchi or rales.  Abdominal:  General: Bowel sounds are normal.     Palpations: Abdomen is soft.  Musculoskeletal:        General: Tenderness (t to palpation along his sternum and left chest wall) present. Normal range of motion.     Cervical back: Normal range of motion.     Right lower leg: No edema.     Left lower leg: No edema.     Comments: Chest pain is exacerbated by  lifting his left arm  Skin:    General: Skin is warm and dry.  Neurological:     Mental Status: He is alert and oriented to person, place, and time.    Laboratory examination:    CMP Latest Ref Rng & Units 10/11/2020 06/14/2020 05/27/2019  Glucose 65 - 99 mg/dL 93 97 78  BUN 6 - 20 mg/dL _0 Creatinine 0.76 - 1.27 mg/dL 1.01 1.02 0.91  Sodium 134 - 144 mmol/L 141 141 139  Potassium 3.5 - 5.2 mmol/L 4.4 4.3 4.4  Chloride 96 - 106 mmol/L 102 101 100  CO2 20 - 29 mmol/L _1 Calcium 8.7 - 10.2 mg/dL 9.5 9.5 9.5  Total Protein 6.0 - 8.5 g/dL 7.2 7.5 7.5  Total Bilirubin 0.0 - 1.2 mg/dL 0.6 0.4 0.4  Alkaline Phos 44 - 121 IU/L 64 62 72  AST 0 - 40 IU/L _2 ALT 0 - 44 IU/L _3 CBC Latest Ref Rng & Units 06/14/2020 05/27/2019 03/13/2019  WBC 3.4 - 10.8 x10E3/uL 3.7 3.9 4.4  Hemoglobin 13.0 - 17.7 g/dL 15.9 15.6 15.2  Hematocrit 37.5 - 51.0 % 47.1 46.3 45.5  Platelets 150 - 450 x10E3/uL 184 183 185   Lipid Panel     Component Value Date/Time   CHOL 246 (H) 10/11/2020 1046   TRIG 90 10/11/2020 1046   HDL 74 10/11/2020 1046   CHOLHDL 3.3 10/11/2020 1046   LDLCALC 157 (H) 10/11/2020 1046   HEMOGLOBIN A1C Lab Results  Component Value Date   HGBA1C 5.9 (H) 10/11/2020   TSH No results for input(s): TSH in the last 8760 hours.   Allergies and Medications   No Known Allergies Current Outpatient Medications  Medication Instructions  . Multiple Vitamin (MULTIVITAMIN) tablet 1 tablet, Oral, As needed  . VITAMIN D PO Oral, Daily    Radiology   No results found.  Cardiac Studies:   Treadmill exercise stress test 02/05/2017: Indication: CP, Abn EKG Resting EKG demonstrates NSR. Borderline criteria for LVH. The patient exercised according to Bruce Protocol, Total time recorded 10:11 min achieving max heart rate of 169 which was 90% of THR for age and 12.09 METS of work. Stress terminated due to THR (>85% MPHR)/MPHR met. Normal BP response. There was 2 mm  upsloping ST depression at peak exercise which normalized immediately in recovery. The test is negative for ischemia. There were no significant arrhythmias. Normal BP response. Rec: No e/o ischemia by GXT. Exercise tolerence is excellent. Continue Preventive therapy.  Echocardiogram 03/17/2019: Normal LV systolic function with EF 55%. Left ventricle cavity is normal in size. Mild concentric hypertrophy of the left ventricle. Normal global wall motion. Normal diastolic filling pattern.  No significant valvular abnormalities. No evidence of pulmonary hypertension. No significant change compared to previous study in 2018.    EKG   EKG 11/03/2020: Sinus rhythm at a rate of 63 bpm.  Biatrial enlargement.  Normal axis.  LVH with secondary ST-T abnormalities, cannot exclude inferior lateral ischemia.  Compared to EKG 03/14/2019, no significant change.  EKG 03/14/2019: Normal sinus rhythm at 62 bpm, left atrial enlargement, normal axis, T wave abnormality in inferior leads, LVH. Normal QT interval. No changes compared to EKG in 2018.   Assessment:     ICD-10-CM   1. Non-cardiac chest pain  R07.89 EKG 12-Lead  2. Hypercholesterolemia  E78.00    No orders of the defined types were placed in this encounter.  Medications Discontinued During This Encounter  Medication Reason  . fexofenadine (ALLEGRA) 180 MG tablet Error  . montelukast (SINGULAIR) 10 MG tablet Error  . pantoprazole (PROTONIX) 40 MG tablet Error  . predniSONE (STERAPRED UNI-PAK 21 TAB) 10 MG (21) TBPK tablet Error  . rosuvastatin (CRESTOR) 10 MG tablet Error     Recommendations:   Sharief Wainwright  is a 40 y.o. male  with  Hyperlipidemia last seen in 2018 for musculoskeletal chest pain and abnormal EKG, underwent echocardiogram that revealed moderate LVH, normal LVEF. Treadmill stress test in 2018 was considered low risk study.  Patient now presents with similar complaints of chest pain.  Patient symptoms are highly suggestive of  musculoskeletal etiology.  His exam is reassuring that chest pain is not related to underlying cardiovascular disease as it is reproducible with palpation.  Patient's EKG is unchanged compared to prior.  Patient is very active, running on the treadmill multiple times per week without issue or chest pain.  In view of symptoms highly suggestive of musculoskeletal etiology as well as low risk stress test in the past recommend evaluation for noncardiac etiology of patient's symptoms.  Blood pressure is under excellent control.  In regard to lipids, I personally reviewed external labs and his lipids are not well controlled.  Discussed with patient regarding statin therapy.  Patient reports his PCP had prescribed Crestor however patient opted to first attempt to make diet and lifestyle modifications.  PCP plans to recheck patient's lipid profile testing in 3 months and reevaluate need for statin medication.  I have again discussed with the patient regarding the signs and symptoms of angina pectoris, presentation and when to seek help. Diffenentiating non anginal pain discussed.    Patient is stable from a cardiovascular standpoint.  Follow-up as needed.   Alethia Berthold, PA-C 11/03/2020, 4:22 PM Office: [phone]

## 2020-11-10 ENCOUNTER — Encounter: Payer: Self-pay | Admitting: Emergency Medicine

## 2020-11-10 ENCOUNTER — Other Ambulatory Visit: Payer: Self-pay

## 2020-11-10 ENCOUNTER — Ambulatory Visit: Payer: Federal, State, Local not specified - PPO | Admitting: Emergency Medicine

## 2020-11-10 VITALS — BP 107/68 | HR 72 | Temp 97.6°F | Resp 16 | Ht 68.0 in | Wt 171.0 lb

## 2020-11-10 DIAGNOSIS — M549 Dorsalgia, unspecified: Secondary | ICD-10-CM | POA: Diagnosis not present

## 2020-11-10 DIAGNOSIS — R7303 Prediabetes: Secondary | ICD-10-CM

## 2020-11-10 DIAGNOSIS — E785 Hyperlipidemia, unspecified: Secondary | ICD-10-CM | POA: Diagnosis not present

## 2020-11-10 LAB — POCT URINALYSIS DIP (MANUAL ENTRY)
Bilirubin, UA: NEGATIVE
Blood, UA: NEGATIVE
Glucose, UA: NEGATIVE mg/dL
Ketones, POC UA: NEGATIVE mg/dL
Leukocytes, UA: NEGATIVE
Nitrite, UA: NEGATIVE
Protein Ur, POC: NEGATIVE mg/dL
Spec Grav, UA: <=1.005 — AB (ref 1.010–1.025)
Urobilinogen, UA: 0.2 E.U./dL
pH, UA: 5.5 (ref 5.0–8.0)

## 2020-11-10 NOTE — Patient Instructions (Addendum)
If you have lab work done today you will be contacted with your lab results within the next 2 weeks.  If you have not heard from [place] then please contact us. The fastest way to get your results is to register for My Chart.   IF you received an x-ray today, you will receive an invoice from [facility]. Please contact [facility] at [phone] with questions or concerns regarding your invoice.   IF you received labwork today, you will receive an invoice from [facility]. Please contact [facility] at [phone] with questions or concerns regarding your invoice.   Our billing staff will not be able to assist you with questions regarding bills from these companies.  You will be contacted with the lab results as soon as they are available. The fastest way to get your results is to activate your My Chart account. Instructions are located on the last page of this paperwork. If you have not heard from [place] regarding the results in 2 weeks, please contact this office.     Prediabetes Eating Plan Prediabetes is a condition that causes blood sugar (glucose) levels to be higher than normal. This increases the risk for developing type 2 diabetes (type 2 diabetes mellitus). Working with a health care provider or nutrition specialist (dietitian) to make diet and lifestyle changes can help prevent the onset of diabetes. These changes may help you:  Control your blood glucose levels.  Improve your cholesterol levels.  Manage your blood pressure. What are tips for following this plan? Reading food labels  Read food labels to check the amount of fat, salt (sodium), and sugar in prepackaged foods. Avoid foods that have: ? Saturated fats. ? Trans fats. ? Added sugars.  Avoid foods that have more than 300 milligrams (mg) of sodium per serving. Limit your sodium intake to less than 2,300 mg each day. Shopping  Avoid buying pre-made and processed foods.  Avoid buying drinks with added  sugar. Cooking  Cook with olive oil. Do not use butter, lard, or ghee.  Bake, broil, grill, steam, or boil foods. Avoid frying. Meal planning  Work with your dietitian to create an eating plan that is right for you. This may include tracking how many calories you take in each day. Use a food diary, notebook, or mobile application to track what you eat at each meal.  Consider following a Mediterranean diet. This includes: ? Eating several servings of fresh fruits and vegetables each day. ? Eating fish at least twice a week. ? Eating one serving each day of whole grains, beans, nuts, and seeds. ? Using olive oil instead of other fats. ? Limiting alcohol. ? Limiting red meat. ? Using nonfat or low-fat dairy products.  Consider following a plant-based diet. This includes dietary choices that focus on eating mostly vegetables and fruit, grains, beans, nuts, and seeds.  If you have high blood pressure, you may need to limit your sodium intake or follow a diet such as the DASH (Dietary Approaches to Stop Hypertension) eating plan. The DASH diet aims to lower high blood pressure.   Lifestyle  Set weight loss goals with help from your health care team. It is recommended that most people with prediabetes lose 7% of their body weight.  Exercise for at least 30 minutes 5 or more days a week.  Attend a support group or seek support from a mental health counselor.  Take over-the-counter and prescription medicines only as told by your health care provider. What  foods are recommended? Fruits Berries. Bananas. Apples. Oranges. Grapes. Papaya. Mango. Pomegranate. Kiwi. Grapefruit. Cherries. Vegetables Lettuce. Spinach. Peas. Beets. Cauliflower. Cabbage. Broccoli. Carrots. Tomatoes. Squash. Eggplant. Herbs. Peppers. Onions. Cucumbers. Brussels sprouts. Grains Whole grains, such as whole-wheat or whole-grain breads, crackers, cereals, and pasta. Unsweetened oatmeal. Bulgur. Barley. Quinoa. Brown  rice. Corn or whole-wheat flour tortillas or taco shells. Meats and other proteins Seafood. Poultry without skin. Lean cuts of pork and beef. Tofu. Eggs. Nuts. Beans. Dairy Low-fat or fat-free dairy products, such as yogurt, cottage cheese, and cheese. Beverages Water. Tea. Coffee. Sugar-free or diet soda. Seltzer water. Low-fat or nonfat milk. Milk alternatives, such as soy or almond milk. Fats and oils Olive oil. Canola oil. Sunflower oil. Grapeseed oil. Avocado. Walnuts. Sweets and desserts Sugar-free or low-fat pudding. Sugar-free or low-fat ice cream and other frozen treats. Seasonings and condiments Herbs. Sodium-free spices. Mustard. Relish. Low-salt, low-sugar ketchup. Low-salt, low-sugar barbecue sauce. Low-fat or fat-free mayonnaise. The items listed above may not be a complete list of recommended foods and beverages. Contact a dietitian for more information. What foods are not recommended? Fruits Fruits canned with syrup. Vegetables Canned vegetables. Frozen vegetables with butter or cream sauce. Grains Refined white flour and flour products, such as bread, pasta, snack foods, and cereals. Meats and other proteins Fatty cuts of meat. Poultry with skin. Breaded or fried meat. Processed meats. Dairy Full-fat yogurt, cheese, or milk. Beverages Sweetened drinks, such as iced tea and soda. Fats and oils Butter. Lard. Ghee. Sweets and desserts Baked goods, such as cake, cupcakes, pastries, cookies, and cheesecake. Seasonings and condiments Spice mixes with added salt. Ketchup. Barbecue sauce. Mayonnaise. The items listed above may not be a complete list of foods and beverages that are not recommended. Contact a dietitian for more information. Where to find more information  American Diabetes Association: www.diabetes.org Summary  You may need to make diet and lifestyle changes to help prevent the onset of diabetes. These changes can help you control blood sugar, improve  cholesterol levels, and manage blood pressure.  Set weight loss goals with help from your health care team. It is recommended that most people with prediabetes lose 7% of their body weight.  Consider following a Mediterranean diet. This includes eating plenty of fresh fruits and vegetables, whole grains, beans, nuts, seeds, fish, and low-fat dairy, and using olive oil instead of other fats. This information is not intended to replace advice given to you by your health care provider. Make sure you discuss any questions you have with your health care provider. Document Revised: 11/20/2019 Document Reviewed: 11/20/2019 Elsevier Patient Education  2021 [employer] Inc.  Dyslipidemia Dyslipidemia is an imbalance of waxy, fat-like substances (lipids) in the blood. The body needs lipids in small amounts. Dyslipidemia often involves a high level of cholesterol or triglycerides, which are types of lipids. Common forms of dyslipidemia include:  High levels of LDL cholesterol. LDL is the type of cholesterol that causes fatty deposits (plaques) to build up in the blood vessels that carry blood away from your heart (arteries).  Low levels of HDL cholesterol. HDL cholesterol is the type of cholesterol that protects against heart disease. High levels of HDL remove the LDL buildup from arteries.  High levels of triglycerides. Triglycerides are a fatty substance in the blood that is linked to a buildup of plaques in the arteries. What are the causes? Primary dyslipidemia is caused by changes (mutations) in genes that are passed down through families (inherited). These mutations cause several types  of dyslipidemia. Secondary dyslipidemia is caused by lifestyle choices and diseases that lead to dyslipidemia, such as:  Eating a diet that is high in animal fat.  Not getting enough exercise.  Having diabetes, kidney disease, liver disease, or thyroid disease.  Drinking large amounts of alcohol.  Using certain  medicines. What increases the risk? You are more likely to develop this condition if you are an older man or if you are a woman who has gone through menopause. Other risk factors include:  Having a family history of dyslipidemia.  Taking certain medicines, including birth control pills, steroids, some diuretics, and beta-blockers.  Smoking cigarettes.  Eating a high-fat diet.  Having certain medical conditions such as diabetes, polycystic ovary syndrome (PCOS), kidney disease, liver disease, or hypothyroidism.  Not exercising regularly.  Being overweight or obese with too much belly fat. What are the signs or symptoms? In most cases, dyslipidemia does not usually cause any symptoms. In severe cases, very high lipid levels can cause:  Fatty bumps under the skin (xanthomas).  White or gray ring around the black center (pupil) of the eye. Very high triglyceride levels can cause inflammation of the pancreas (pancreatitis). How is this diagnosed? Your health care provider may diagnose dyslipidemia based on a routine blood test (fasting blood test). Because most people do not have symptoms of the condition, this blood testing (lipid profile) is done on adults age 74 and older and is repeated every 5 years. This test checks:  Total cholesterol. This measures the total amount of cholesterol in your blood, including LDL cholesterol, HDL cholesterol, and triglycerides. A healthy number is below 200.  LDL cholesterol. The target number for LDL cholesterol is different for each person, depending on individual risk factors. Ask your health care provider what your LDL cholesterol should be.  HDL cholesterol. An HDL level of 60 or higher is best because it helps to protect against heart disease. A number below 40 for men or below 50 for women increases the risk for heart disease.  Triglycerides. A healthy triglyceride number is below 150. If your lipid profile is abnormal, your health care  provider may do other blood tests.   How is this treated? Treatment depends on the type of dyslipidemia that you have and your other risk factors for heart disease and stroke. Your health care provider will have a target range for your lipid levels based on this information. For many people, this condition may be treated by lifestyle changes, such as diet and exercise. Your health care provider may recommend that you:  Get regular exercise.  Make changes to your diet.  Quit smoking if you smoke. If diet changes and exercise do not help you reach your goals, your health care provider may also prescribe medicine to lower lipids. The most commonly prescribed type of medicine lowers your LDL cholesterol (statin drug). If you have a high triglyceride level, your provider may prescribe another type of drug (fibrate) or an omega-3 fish oil supplement, or both. Follow these instructions at home: Eating and drinking  Follow instructions from your health care provider or dietitian about eating or drinking restrictions.  Eat a healthy diet as told by your health care provider. This can help you reach and maintain a healthy weight, lower your LDL cholesterol, and raise your HDL cholesterol. This may include: ? Limiting your calories, if you are overweight. ? Eating more fruits, vegetables, whole grains, fish, and lean meats. ? Limiting saturated fat, trans fat, and cholesterol.  If you drink alcohol: ? Limit how much you use. ? Be aware of how much alcohol is in your drink. In the [place] one drink equals one 12 oz bottle of beer (355 mL), one 5 oz glass of wine (148 mL), or one 1 oz glass of hard liquor (44 mL).  Do not drink alcohol if: ? Your health care provider tells you not to drink. ? You are pregnant, may be pregnant, or are planning to become pregnant. Activity  Get regular exercise. Start an exercise and strength training program as told by your health care provider. Ask your health care  provider what activities are safe for you. Your health care provider may recommend: ? 30 minutes of aerobic activity 4-6 days a week. Brisk walking is an example of aerobic activity. ? Strength training 2 days a week. General instructions  Do not use any products that contain nicotine or tobacco, such as cigarettes, e-cigarettes, and chewing tobacco. If you need help quitting, ask your health care provider.  Take over-the-counter and prescription medicines only as told by your health care provider. This includes supplements.  Keep all follow-up visits as told by your health care provider.   Contact a health care provider if:  You are: ? Having trouble sticking to your exercise or diet plan. ? Struggling to quit smoking or control your use of alcohol. Summary  Dyslipidemia often involves a high level of cholesterol or triglycerides, which are types of lipids.  Treatment depends on the type of dyslipidemia that you have and your other risk factors for heart disease and stroke.  For many people, treatment starts with lifestyle changes, such as diet and exercise.  Your health care provider may prescribe medicine to lower lipids. This information is not intended to replace advice given to you by your health care provider. Make sure you discuss any questions you have with your health care provider. Document Revised: 04/15/2018 Document Reviewed: 03/22/2018 Elsevier Patient Education  2021 [employer].

## 2020-11-10 NOTE — Progress Notes (Signed)
Allen [name] 39 y.o.   Chief Complaint  Patient presents with  . Back Pain    Per patient for 1 week upper area    HISTORY OF PRESENT ILLNESS: This is a 40 y.o. male complaining of back pain that lasted about a week and is now much better. Denies any other complaints or medical concerns today. Works at the [employer].  Works out regularly. History of dyslipidemia and recently started on rosuvastatin but not taking it yet.  Prefers to work on his diet and nutrition before starting medication. Also has history of prediabetes. Seen by me on 10/11/2020 with paresthesia of fingers on left hand which has completely resolved now. Cardiology visit on 11/03/2020 as follows: Recommendations:   Allen [name]  is a 40 y.o. male  with  Hyperlipidemia last seen in 2018 for musculoskeletal chest pain and abnormal EKG, underwent echocardiogram that revealed moderate LVH, normal LVEF. Treadmill stress test in 2018 was considered low risk study.  Patient now presents with similar complaints of chest pain.  Patient symptoms are highly suggestive of musculoskeletal etiology.  His exam is reassuring that chest pain is not related to underlying cardiovascular disease as it is reproducible with palpation.  Patient's EKG is unchanged compared to prior.  Patient is very active, running on the treadmill multiple times per week without issue or chest pain.  In view of symptoms highly suggestive of musculoskeletal etiology as well as low risk stress test in the past recommend evaluation for noncardiac etiology of patient's symptoms.  Blood pressure is under excellent control.  In regard to lipids, I personally reviewed external labs and his lipids are not well controlled.  Discussed with patient regarding statin therapy.  Patient reports his PCP had prescribed Crestor however patient opted to first attempt to make diet and lifestyle modifications.  PCP plans to recheck patient's lipid profile testing in 3 months and  reevaluate need for statin medication.  I have again discussed with the patient regarding the signs and symptoms of angina pectoris, presentation and when to seek help. Diffenentiating non anginal pain discussed.    Patient is stable from a cardiovascular standpoint.  Follow-up as needed.   Rayford Halsted, PA-C 11/03/2020, 4:22 PM Office: [phone]    HPI   Prior to Admission medications   Medication Sig Start Date End Date Taking? Authorizing Provider  Multiple Vitamin (MULTIVITAMIN) tablet Take 1 tablet by mouth as needed.   Yes [provider]  VITAMIN D PO Take by mouth daily.   Yes [provider]    No Known Allergies  Patient Active Problem List   Diagnosis Date Noted  . Gastroesophageal reflux disease with esophagitis 02/11/2019    Past Medical History:  Diagnosis Date  . Allergies     Past Surgical History:  Procedure Laterality Date  . None      Social History   Socioeconomic History  . Marital status: Married    Spouse name: Not on file  . Number of children: 0  . Years of education: Not on file  . Highest education level: Not on file  Occupational History  . Occupation: Delivery  Tobacco Use  . Smoking status: Never Smoker  . Smokeless tobacco: Never Used  Vaping Use  . Vaping Use: Never used  Substance and Sexual Activity  . Alcohol use: Yes    Comment: Occassionlly   . Drug use: No    Comment: No marijuana  . Sexual activity: Yes  Other Topics Concern  .  Not on file  Social History Narrative  . Not on file   Social Determinants of Health   Financial Resource Strain: Not on file  Food Insecurity: Not on file  Transportation Needs: Not on file  Physical Activity: Not on file  Stress: Not on file  Social Connections: Not on file  Intimate Partner Violence: Not on file    Family History  Problem Relation Age of Onset  . Diabetes Mother   . High Cholesterol Mother   . Diabetes Father   . High  Cholesterol Father   . Colon cancer Neg Hx   . Esophageal cancer Neg Hx   . Liver cancer Neg Hx   . Pancreatic cancer Neg Hx   . Rectal cancer Neg Hx   . Stomach cancer Neg Hx      Review of Systems  Constitutional: Negative.  Negative for chills and fever.  HENT: Negative.  Negative for congestion and sore throat.   Respiratory: Negative.  Negative for cough and shortness of breath.   Cardiovascular: Negative.  Negative for chest pain and palpitations.  Gastrointestinal: Negative for abdominal pain, diarrhea, nausea and vomiting.  Genitourinary: Negative.  Negative for dysuria and hematuria.  Musculoskeletal: Positive for back pain.  Skin: Negative.  Negative for rash.  Neurological: Negative.  Negative for dizziness and headaches.  All other systems reviewed and are negative.    Physical Exam Vitals reviewed.  Constitutional:      Appearance: Normal appearance.  HENT:     Head: Normocephalic.  Eyes:     Extraocular Movements: Extraocular movements intact.     Conjunctiva/sclera: Conjunctivae normal.     Pupils: Pupils are equal, round, and reactive to light.  Cardiovascular:     Rate and Rhythm: Normal rate and regular rhythm.     Pulses: Normal pulses.     Heart sounds: Normal heart sounds.  Pulmonary:     Effort: Pulmonary effort is normal.     Breath sounds: Normal breath sounds.  Musculoskeletal:        General: Normal range of motion.     Cervical back: Normal and normal range of motion.     Thoracic back: Normal.     Lumbar back: Normal.  Skin:    General: Skin is warm and dry.     Capillary Refill: Capillary refill takes less than 2 seconds.  Neurological:     General: No focal deficit present.     Mental Status: He is alert and oriented to person, place, and time.  Psychiatric:        Mood and Affect: Mood normal.        Behavior: Behavior normal.     Results for orders placed or performed in visit on 11/10/20 (from the past 24 hour(s))  POCT  urinalysis dipstick     Status: Abnormal   Collection Time: 11/10/20 11:03 AM  Result Value Ref Range   Color, UA yellow yellow   Clarity, UA clear clear   Glucose, UA negative negative mg/dL   Bilirubin, UA negative negative   Ketones, POC UA negative negative mg/dL   Spec Grav, UA <=1.761 (A) 1.010 - 1.025   Blood, UA negative negative   pH, UA 5.5 5.0 - 8.0   Protein Ur, POC negative negative mg/dL   Urobilinogen, UA 0.2 0.2 or 1.0 E.U./dL   Nitrite, UA Negative Negative   Leukocytes, UA Negative Negative    A total of 30 minutes was spent with the patient, greater than  50% of which was in counseling/coordination of care regarding dyslipidemia and prediabetes and cardiovascular risks associated with these conditions, review of most recent office visit notes, review of most recent cardiologist's office visit notes, education on nutrition, review of most recent blood work results and abnormal lipid profile, differential diagnosis of back pain, prognosis, documentation, and need for follow-up.   ASSESSMENT & PLAN: Addis was seen today for back pain.  Diagnoses and all orders for this visit:  Musculoskeletal back pain -     POCT urinalysis dipstick  Dyslipidemia  Prediabetes -     POCT urinalysis dipstick    Patient Instructions       If you have lab work done today you will be contacted with your lab results within the next 2 weeks.  If you have not heard from [place] then please contact us. The fastest way to get your results is to register for My Chart.   IF you received an x-ray today, you will receive an invoice from [facility]. Please contact [facility] at [phone] with questions or concerns regarding your invoice.   IF you received labwork today, you will receive an invoice from [facility]. Please contact [facility] at [phone] with questions or concerns regarding your invoice.   Our billing staff will not be able to assist you with questions  regarding bills from these companies.  You will be contacted with the lab results as soon as they are available. The fastest way to get your results is to activate your My Chart account. Instructions are located on the last page of this paperwork. If you have not heard from [place] regarding the results in 2 weeks, please contact this office.     Prediabetes Eating Plan Prediabetes is a condition that causes blood sugar (glucose) levels to be higher than normal. This increases the risk for developing type 2 diabetes (type 2 diabetes mellitus). Working with a health care provider or nutrition specialist (dietitian) to make diet and lifestyle changes can help prevent the onset of diabetes. These changes may help you:  Control your blood glucose levels.  Improve your cholesterol levels.  Manage your blood pressure. What are tips for following this plan? Reading food labels  Read food labels to check the amount of fat, salt (sodium), and sugar in prepackaged foods. Avoid foods that have: ? Saturated fats. ? Trans fats. ? Added sugars.  Avoid foods that have more than 300 milligrams (mg) of sodium per serving. Limit your sodium intake to less than 2,300 mg each day. Shopping  Avoid buying pre-made and processed foods.  Avoid buying drinks with added sugar. Cooking  Cook with olive oil. Do not use butter, lard, or ghee.  Bake, broil, grill, steam, or boil foods. Avoid frying. Meal planning  Work with your dietitian to create an eating plan that is right for you. This may include tracking how many calories you take in each day. Use a food diary, notebook, or mobile application to track what you eat at each meal.  Consider following a Mediterranean diet. This includes: ? Eating several servings of fresh fruits and vegetables each day. ? Eating fish at least twice a week. ? Eating one serving each day of whole grains, beans, nuts, and seeds. ? Using olive oil instead of other  fats. ? Limiting alcohol. ? Limiting red meat. ? Using nonfat or low-fat dairy products.  Consider following a plant-based diet. This includes dietary choices that focus on eating mostly vegetables and fruit, grains, beans,  nuts, and seeds.  If you have high blood pressure, you may need to limit your sodium intake or follow a diet such as the DASH (Dietary Approaches to Stop Hypertension) eating plan. The DASH diet aims to lower high blood pressure.   Lifestyle  Set weight loss goals with help from your health care team. It is recommended that most people with prediabetes lose 7% of their body weight.  Exercise for at least 30 minutes 5 or more days a week.  Attend a support group or seek support from a mental health counselor.  Take over-the-counter and prescription medicines only as told by your health care provider. What foods are recommended? Fruits Berries. Bananas. Apples. Oranges. Grapes. Papaya. Mango. Pomegranate. Kiwi. Grapefruit. Cherries. Vegetables Lettuce. Spinach. Peas. Beets. Cauliflower. Cabbage. Broccoli. Carrots. Tomatoes. Squash. Eggplant. Herbs. Peppers. Onions. Cucumbers. Brussels sprouts. Grains Whole grains, such as whole-wheat or whole-grain breads, crackers, cereals, and pasta. Unsweetened oatmeal. Bulgur. Barley. Quinoa. Brown rice. Corn or whole-wheat flour tortillas or taco shells. Meats and other proteins Seafood. Poultry without skin. Lean cuts of pork and beef. Tofu. Eggs. Nuts. Beans. Dairy Low-fat or fat-free dairy products, such as yogurt, cottage cheese, and cheese. Beverages Water. Tea. Coffee. Sugar-free or diet soda. Seltzer water. Low-fat or nonfat milk. Milk alternatives, such as soy or almond milk. Fats and oils Olive oil. Canola oil. Sunflower oil. Grapeseed oil. Avocado. Walnuts. Sweets and desserts Sugar-free or low-fat pudding. Sugar-free or low-fat ice cream and other frozen treats. Seasonings and condiments Herbs. Sodium-free spices.  Mustard. Relish. Low-salt, low-sugar ketchup. Low-salt, low-sugar barbecue sauce. Low-fat or fat-free mayonnaise. The items listed above may not be a complete list of recommended foods and beverages. Contact a dietitian for more information. What foods are not recommended? Fruits Fruits canned with syrup. Vegetables Canned vegetables. Frozen vegetables with butter or cream sauce. Grains Refined white flour and flour products, such as bread, pasta, snack foods, and cereals. Meats and other proteins Fatty cuts of meat. Poultry with skin. Breaded or fried meat. Processed meats. Dairy Full-fat yogurt, cheese, or milk. Beverages Sweetened drinks, such as iced tea and soda. Fats and oils Butter. Lard. Ghee. Sweets and desserts Baked goods, such as cake, cupcakes, pastries, cookies, and cheesecake. Seasonings and condiments Spice mixes with added salt. Ketchup. Barbecue sauce. Mayonnaise. The items listed above may not be a complete list of foods and beverages that are not recommended. Contact a dietitian for more information. Where to find more information  American Diabetes Association: www.diabetes.org Summary  You may need to make diet and lifestyle changes to help prevent the onset of diabetes. These changes can help you control blood sugar, improve cholesterol levels, and manage blood pressure.  Set weight loss goals with help from your health care team. It is recommended that most people with prediabetes lose 7% of their body weight.  Consider following a Mediterranean diet. This includes eating plenty of fresh fruits and vegetables, whole grains, beans, nuts, seeds, fish, and low-fat dairy, and using olive oil instead of other fats. This information is not intended to replace advice given to you by your health care provider. Make sure you discuss any questions you have with your health care provider. Document Revised: 11/20/2019 Document Reviewed: 11/20/2019 Elsevier Patient  Education  2021 Elsevier Inc.  Dyslipidemia Dyslipidemia is an imbalance of waxy, fat-like substances (lipids) in the blood. The body needs lipids in small amounts. Dyslipidemia often involves a high level of cholesterol or triglycerides, which are types of lipids. Common forms  of dyslipidemia include:  High levels of LDL cholesterol. LDL is the type of cholesterol that causes fatty deposits (plaques) to build up in the blood vessels that carry blood away from your heart (arteries).  Low levels of HDL cholesterol. HDL cholesterol is the type of cholesterol that protects against heart disease. High levels of HDL remove the LDL buildup from arteries.  High levels of triglycerides. Triglycerides are a fatty substance in the blood that is linked to a buildup of plaques in the arteries. What are the causes? Primary dyslipidemia is caused by changes (mutations) in genes that are passed down through families (inherited). These mutations cause several types of dyslipidemia. Secondary dyslipidemia is caused by lifestyle choices and diseases that lead to dyslipidemia, such as:  Eating a diet that is high in animal fat.  Not getting enough exercise.  Having diabetes, kidney disease, liver disease, or thyroid disease.  Drinking large amounts of alcohol.  Using certain medicines. What increases the risk? You are more likely to develop this condition if you are an older man or if you are a woman who has gone through menopause. Other risk factors include:  Having a family history of dyslipidemia.  Taking certain medicines, including birth control pills, steroids, some diuretics, and beta-blockers.  Smoking cigarettes.  Eating a high-fat diet.  Having certain medical conditions such as diabetes, polycystic ovary syndrome (PCOS), kidney disease, liver disease, or hypothyroidism.  Not exercising regularly.  Being overweight or obese with too much belly fat. What are the signs or symptoms? In  most cases, dyslipidemia does not usually cause any symptoms. In severe cases, very high lipid levels can cause:  Fatty bumps under the skin (xanthomas).  White or gray ring around the black center (pupil) of the eye. Very high triglyceride levels can cause inflammation of the pancreas (pancreatitis). How is this diagnosed? Your health care provider may diagnose dyslipidemia based on a routine blood test (fasting blood test). Because most people do not have symptoms of the condition, this blood testing (lipid profile) is done on adults age 67 and older and is repeated every 5 years. This test checks:  Total cholesterol. This measures the total amount of cholesterol in your blood, including LDL cholesterol, HDL cholesterol, and triglycerides. A healthy number is below 200.  LDL cholesterol. The target number for LDL cholesterol is different for each person, depending on individual risk factors. Ask your health care provider what your LDL cholesterol should be.  HDL cholesterol. An HDL level of 60 or higher is best because it helps to protect against heart disease. A number below 40 for men or below 50 for women increases the risk for heart disease.  Triglycerides. A healthy triglyceride number is below 150. If your lipid profile is abnormal, your health care provider may do other blood tests.   How is this treated? Treatment depends on the type of dyslipidemia that you have and your other risk factors for heart disease and stroke. Your health care provider will have a target range for your lipid levels based on this information. For many people, this condition may be treated by lifestyle changes, such as diet and exercise. Your health care provider may recommend that you:  Get regular exercise.  Make changes to your diet.  Quit smoking if you smoke. If diet changes and exercise do not help you reach your goals, your health care provider may also prescribe medicine to lower lipids. The most  commonly prescribed type of medicine lowers your LDL  cholesterol (statin drug). If you have a high triglyceride level, your provider may prescribe another type of drug (fibrate) or an omega-3 fish oil supplement, or both. Follow these instructions at home: Eating and drinking  Follow instructions from your health care provider or dietitian about eating or drinking restrictions.  Eat a healthy diet as told by your health care provider. This can help you reach and maintain a healthy weight, lower your LDL cholesterol, and raise your HDL cholesterol. This may include: ? Limiting your calories, if you are overweight. ? Eating more fruits, vegetables, whole grains, fish, and lean meats. ? Limiting saturated fat, trans fat, and cholesterol.  If you drink alcohol: ? Limit how much you use. ? Be aware of how much alcohol is in your drink. In the [place] one drink equals one 12 oz bottle of beer (355 mL), one 5 oz glass of wine (148 mL), or one 1 oz glass of hard liquor (44 mL).  Do not drink alcohol if: ? Your health care provider tells you not to drink. ? You are pregnant, may be pregnant, or are planning to become pregnant. Activity  Get regular exercise. Start an exercise and strength training program as told by your health care provider. Ask your health care provider what activities are safe for you. Your health care provider may recommend: ? 30 minutes of aerobic activity 4-6 days a week. Brisk walking is an example of aerobic activity. ? Strength training 2 days a week. General instructions  Do not use any products that contain nicotine or tobacco, such as cigarettes, e-cigarettes, and chewing tobacco. If you need help quitting, ask your health care provider.  Take over-the-counter and prescription medicines only as told by your health care provider. This includes supplements.  Keep all follow-up visits as told by your health care provider.   Contact a health care provider if:  You  are: ? Having trouble sticking to your exercise or diet plan. ? Struggling to quit smoking or control your use of alcohol. Summary  Dyslipidemia often involves a high level of cholesterol or triglycerides, which are types of lipids.  Treatment depends on the type of dyslipidemia that you have and your other risk factors for heart disease and stroke.  For many people, treatment starts with lifestyle changes, such as diet and exercise.  Your health care provider may prescribe medicine to lower lipids. This information is not intended to replace advice given to you by your health care provider. Make sure you discuss any questions you have with your health care provider. Document Revised: 04/15/2018 Document Reviewed: 03/22/2018 Elsevier Patient Education  2021 Elsevier Inc.      Edwina Barth, MD [facility]

## 2020-12-09 DIAGNOSIS — L810 Postinflammatory hyperpigmentation: Secondary | ICD-10-CM | POA: Diagnosis not present

## 2020-12-09 DIAGNOSIS — L280 Lichen simplex chronicus: Secondary | ICD-10-CM | POA: Diagnosis not present

## 2020-12-13 ENCOUNTER — Ambulatory Visit: Payer: BC Managed Care – PPO | Admitting: Orthopaedic Surgery

## 2020-12-13 DIAGNOSIS — E559 Vitamin D deficiency, unspecified: Secondary | ICD-10-CM | POA: Diagnosis not present

## 2020-12-13 DIAGNOSIS — E782 Mixed hyperlipidemia: Secondary | ICD-10-CM | POA: Diagnosis not present

## 2020-12-13 DIAGNOSIS — L089 Local infection of the skin and subcutaneous tissue, unspecified: Secondary | ICD-10-CM | POA: Diagnosis not present

## 2020-12-13 DIAGNOSIS — J301 Allergic rhinitis due to pollen: Secondary | ICD-10-CM | POA: Diagnosis not present

## 2021-03-03 DIAGNOSIS — M25511 Pain in right shoulder: Secondary | ICD-10-CM | POA: Diagnosis not present

## 2021-03-24 DIAGNOSIS — M25511 Pain in right shoulder: Secondary | ICD-10-CM | POA: Diagnosis not present

## 2021-04-11 ENCOUNTER — Encounter: Payer: Self-pay | Admitting: Emergency Medicine

## 2021-04-11 ENCOUNTER — Ambulatory Visit: Payer: Federal, State, Local not specified - PPO | Admitting: Emergency Medicine

## 2021-04-11 ENCOUNTER — Other Ambulatory Visit: Payer: Self-pay

## 2021-04-11 VITALS — BP 122/86 | HR 71 | Temp 98.5°F

## 2021-04-11 DIAGNOSIS — M25511 Pain in right shoulder: Secondary | ICD-10-CM

## 2021-04-11 DIAGNOSIS — R7303 Prediabetes: Secondary | ICD-10-CM | POA: Insufficient documentation

## 2021-04-11 DIAGNOSIS — Z8639 Personal history of other endocrine, nutritional and metabolic disease: Secondary | ICD-10-CM | POA: Insufficient documentation

## 2021-04-11 DIAGNOSIS — G8929 Other chronic pain: Secondary | ICD-10-CM | POA: Insufficient documentation

## 2021-04-11 DIAGNOSIS — E785 Hyperlipidemia, unspecified: Secondary | ICD-10-CM | POA: Diagnosis not present

## 2021-04-11 LAB — LIPID PANEL
Cholesterol: 225 mg/dL — ABNORMAL HIGH (ref 0–200)
HDL: 75.5 mg/dL (ref >39.00)
LDL Cholesterol: 138 mg/dL — ABNORMAL HIGH (ref 0–99)
NonHDL: 149.82
Total CHOL/HDL Ratio: 3
Triglycerides: 61 mg/dL (ref 0.0–149.0)
VLDL: 12.2 mg/dL (ref 0.0–40.0)

## 2021-04-11 LAB — COMPREHENSIVE METABOLIC PANEL
ALT: 15 U/L (ref 0–53)
AST: 20 U/L (ref 0–37)
Albumin: 4.3 g/dL (ref 3.5–5.2)
Alkaline Phosphatase: 68 U/L (ref 39–117)
BUN: 14 mg/dL (ref 6–23)
CO2: 31 mEq/L (ref 19–32)
Calcium: 9.2 mg/dL (ref 8.4–10.5)
Chloride: 103 mEq/L (ref 96–112)
Creatinine, Ser: 1.01 mg/dL (ref 0.40–1.50)
GFR: 94.94 mL/min (ref >60.00)
Glucose, Bld: 100 mg/dL — ABNORMAL HIGH (ref 70–99)
Potassium: 4.5 mEq/L (ref 3.5–5.1)
Sodium: 140 mEq/L (ref 135–145)
Total Bilirubin: 0.5 mg/dL (ref 0.2–1.2)
Total Protein: 7.3 g/dL (ref 6.0–8.3)

## 2021-04-11 LAB — HEMOGLOBIN A1C: Hgb A1c MFr Bld: 6.1 % (ref 4.6–6.5)

## 2021-04-11 MED ORDER — MELOXICAM 15 MG PO TABS: 15.0000 mg | ORAL_TABLET | Freq: Every day | ORAL | 0 refills | Status: DC

## 2021-04-11 NOTE — Assessment & Plan Note (Signed)
Stable.  Levels done today.

## 2021-04-11 NOTE — Assessment & Plan Note (Signed)
MRI report from 03/24/2021 of right shoulder reviewed with patient. Needs follow-up with sports medicine.  Referral placed today. May need physical therapy. Take meloxicam 15 mg daily as needed. Cold compresses to right shoulder after working out.

## 2021-04-11 NOTE — Patient Instructions (Signed)

## 2021-04-11 NOTE — Progress Notes (Addendum)
Allen [name] 40 y.o.   Chief Complaint  Patient presents with   Follow-up    6 month follow up, pt states that he has some shoulder pain in both shoulders.    HISTORY OF PRESENT ILLNESS: This is a 40 y.o. male here for 39-month follow-up on prediabetes and dyslipidemia.  On no medications. Fasting.  Needs blood work. Also complaining of chronic right shoulder pain.  Saw orthopedist Dr. Sandria Bales [facility] and had MRI of right shoulder done which showed 3 things: Osteoarthritis, bursitis and tendinopathy.  Received local injection without significant results. Wants referral to different orthopedist.  Continues to have chronic pain to right shoulder. Has history of low testosterone.  Requesting testosterone level.  HPI   Prior to Admission medications   Medication Sig Start Date End Date Taking? Authorizing Provider  Multiple Vitamin (MULTIVITAMIN) tablet Take 1 tablet by mouth as needed.   Yes [provider]  VITAMIN D PO Take by mouth daily.   Yes [provider]    No Known Allergies  Patient Active Problem List   Diagnosis Date Noted   Gastroesophageal reflux disease with esophagitis 02/11/2019    Past Medical History:  Diagnosis Date   Allergies     Past Surgical History:  Procedure Laterality Date   None      Social History   Socioeconomic History   Marital status: Married    Spouse name: Not on file   Number of children: 0   Years of education: Not on file   Highest education level: Not on file  Occupational History   Occupation: Delivery  Tobacco Use   Smoking status: Never   Smokeless tobacco: Never  Vaping Use   Vaping Use: Never used  Substance and Sexual Activity   Alcohol use: Yes    Comment: Occassionlly    Drug use: No    Comment: No marijuana   Sexual activity: Yes  Other Topics Concern   Not on file  Social History Narrative   Not on file   Social Determinants of Health   Financial Resource Strain: Not on  file  Food Insecurity: Not on file  Transportation Needs: Not on file  Physical Activity: Not on file  Stress: Not on file  Social Connections: Not on file  Intimate Partner Violence: Not on file    Family History  Problem Relation Age of Onset   Diabetes Mother    High Cholesterol Mother    Diabetes Father    High Cholesterol Father    Colon cancer Neg Hx    Esophageal cancer Neg Hx    Liver cancer Neg Hx    Pancreatic cancer Neg Hx    Rectal cancer Neg Hx    Stomach cancer Neg Hx      Review of Systems  Constitutional: Negative.  Negative for fever.  HENT: Negative.  Negative for congestion and sore throat.   Respiratory: Negative.  Negative for cough and shortness of breath.   Cardiovascular: Negative.  Negative for chest pain and palpitations.  Gastrointestinal: Negative.  Negative for abdominal pain, blood in stool, diarrhea, melena, nausea and vomiting.  Genitourinary: Negative.  Negative for dysuria and hematuria.  Musculoskeletal:  Positive for joint pain (Right shoulder).  Skin: Negative.   Neurological:  Negative for dizziness and headaches.  All other systems reviewed and are negative.  Today's Vitals   04/11/21 0916  BP: 122/86  Pulse: 71  Temp: 98.5 F (36.9 C)  TempSrc: Oral  SpO2: 98%   There is no height or weight on file to calculate BMI.  Physical Exam Vitals reviewed.  Constitutional:      Appearance: Normal appearance.  HENT:     Head: Normocephalic.     Right Ear: Tympanic membrane, ear canal and external ear normal.     Left Ear: Tympanic membrane, ear canal and external ear normal.     Mouth/Throat:     Mouth: Mucous membranes are moist.     Pharynx: Oropharynx is clear.  Eyes:     Extraocular Movements: Extraocular movements intact.     Conjunctiva/sclera: Conjunctivae normal.     Pupils: Pupils are equal, round, and reactive to light.  Cardiovascular:     Rate and Rhythm: Normal rate and regular rhythm.     Pulses: Normal  pulses.     Heart sounds: Normal heart sounds.  Pulmonary:     Effort: Pulmonary effort is normal.     Breath sounds: Normal breath sounds.  Abdominal:     General: Bowel sounds are normal. There is no distension.     Palpations: Abdomen is soft.     Tenderness: There is no abdominal tenderness.  Musculoskeletal:        General: Normal range of motion.     Cervical back: Normal range of motion and neck supple.     Right lower leg: No edema.     Left lower leg: No edema.     Comments: Right shoulder: Full range of motion but complaining of some pain.  No erythema or ecchymosis.  No significant swelling.  Mild tenderness to deep palpation  Skin:    General: Skin is warm and dry.     Capillary Refill: Capillary refill takes less than 2 seconds.  Neurological:     General: No focal deficit present.     Mental Status: He is alert and oriented to person, place, and time.  Psychiatric:        Mood and Affect: Mood normal.        Behavior: Behavior normal.     ASSESSMENT & PLAN: Chronic right shoulder pain MRI report from 03/24/2021 of right shoulder reviewed with patient. Needs follow-up with sports medicine.  Referral placed today. May need physical therapy. Take meloxicam 15 mg daily as needed. Cold compresses to right shoulder after working out.  Dyslipidemia Diet and nutrition discussed.  Fasting lipid profile done today.  May need cholesterol medication.  Prediabetes Diet and nutrition discussed.  Advised to decrease amount of daily carbohydrate intake and continue exercise. Cardiovascular risks associated with prediabetes and diabetes discussed.  History of hypogonadism Stable.  Levels done today. Payden was seen today for follow-up.  Diagnoses and all orders for this visit:  Dyslipidemia -     Comprehensive metabolic panel -     Lipid panel  Prediabetes -     Comprehensive metabolic panel -     Hemoglobin A1c  Chronic right shoulder pain -     meloxicam (MOBIC)  15 MG tablet; Take 1 tablet (15 mg total) by mouth daily. -     Ambulatory referral to Sports Medicine  History of hypogonadism -     TestT+TestF+SHBG  Patient Instructions  Shoulder Pain Many things can cause shoulder pain, including: An injury. Moving the shoulder in the same way again and again (overuse). Joint pain (arthritis). Pain can come from: Swelling and irritation (inflammation) of any part of the shoulder. An injury to the shoulder joint. An injury to:  Tissues that connect muscle to bone (tendons). Tissues that connect bones to each other (ligaments). Bones. Follow these instructions at home: Watch for changes in your symptoms. Let your doctor know about them. Followthese instructions to help with your pain. If you have a sling: Wear the sling as told by your doctor. Remove it only as told by your doctor. Loosen the sling if your fingers: Tingle. Become numb. Turn cold and blue. Keep the sling clean. If the sling is not waterproof: Do not let it get wet. Take the sling off when you shower or bathe. Managing pain, stiffness, and swelling  If told, put ice on the painful area: Put ice in a plastic bag. Place a towel between your skin and the bag. Leave the ice on for 20 minutes, 2-3 times a day. Stop putting ice on if it does not help with the pain. Squeeze a soft ball or a foam pad as much as possible. This prevents swelling in the shoulder. It also helps to strengthen the arm.  General instructions Take over-the-counter and prescription medicines only as told by your doctor. Keep all follow-up visits as told by your doctor. This is important. Contact a doctor if: Your pain gets worse. Medicine does not help your pain. You have new pain in your arm, hand, or fingers. Get help right away if: Your arm, hand, or fingers: Tingle. Are numb. Are swollen. Are painful. Turn white or blue. Summary Shoulder pain can be caused by many things. These include  injury, moving the shoulder in the same away again and again, and joint pain. Watch for changes in your symptoms. Let your doctor know about them. This condition may be treated with a sling, ice, and pain medicine. Contact your doctor if the pain gets worse or you have new pain. Get help right away if your arm, hand, or fingers tingle or get numb, swollen, or painful. Keep all follow-up visits as told by your doctor. This is important. This information is not intended to replace advice given to you by your health care provider. Make sure you discuss any questions you have with your healthcare provider. Document Revised: 03/05/2018 Document Reviewed: 03/05/2018 Elsevier Patient Education  2022 Elsevier Inc.    Edwina Barth, MD  Primary Care at [facility]

## 2021-04-11 NOTE — Assessment & Plan Note (Signed)
Diet and nutrition discussed.  Advised to decrease amount of daily carbohydrate intake and continue exercise. Cardiovascular risks associated with prediabetes and diabetes discussed.

## 2021-04-11 NOTE — Assessment & Plan Note (Signed)
Diet and nutrition discussed.  Fasting lipid profile done today.  May need cholesterol medication.

## 2021-04-14 LAB — TESTT+TESTF+SHBG
Sex Hormone Binding: 24.9 nmol/L (ref 16.5–55.9)
Testosterone, Free: 7.7 pg/mL — ABNORMAL LOW (ref 8.7–25.1)
Testosterone, Total, LC/MS: 469.3 ng/dL (ref 264.0–916.0)

## 2021-05-03 IMAGING — DX CHEST - 2 VIEW
2 series · 2 of 2 positions shown · non-contrast
Comparison: Radiograph 07/16/2017

CLINICAL DATA: Chest pain

EXAM:
CHEST - 2 VIEW

[chest pa]
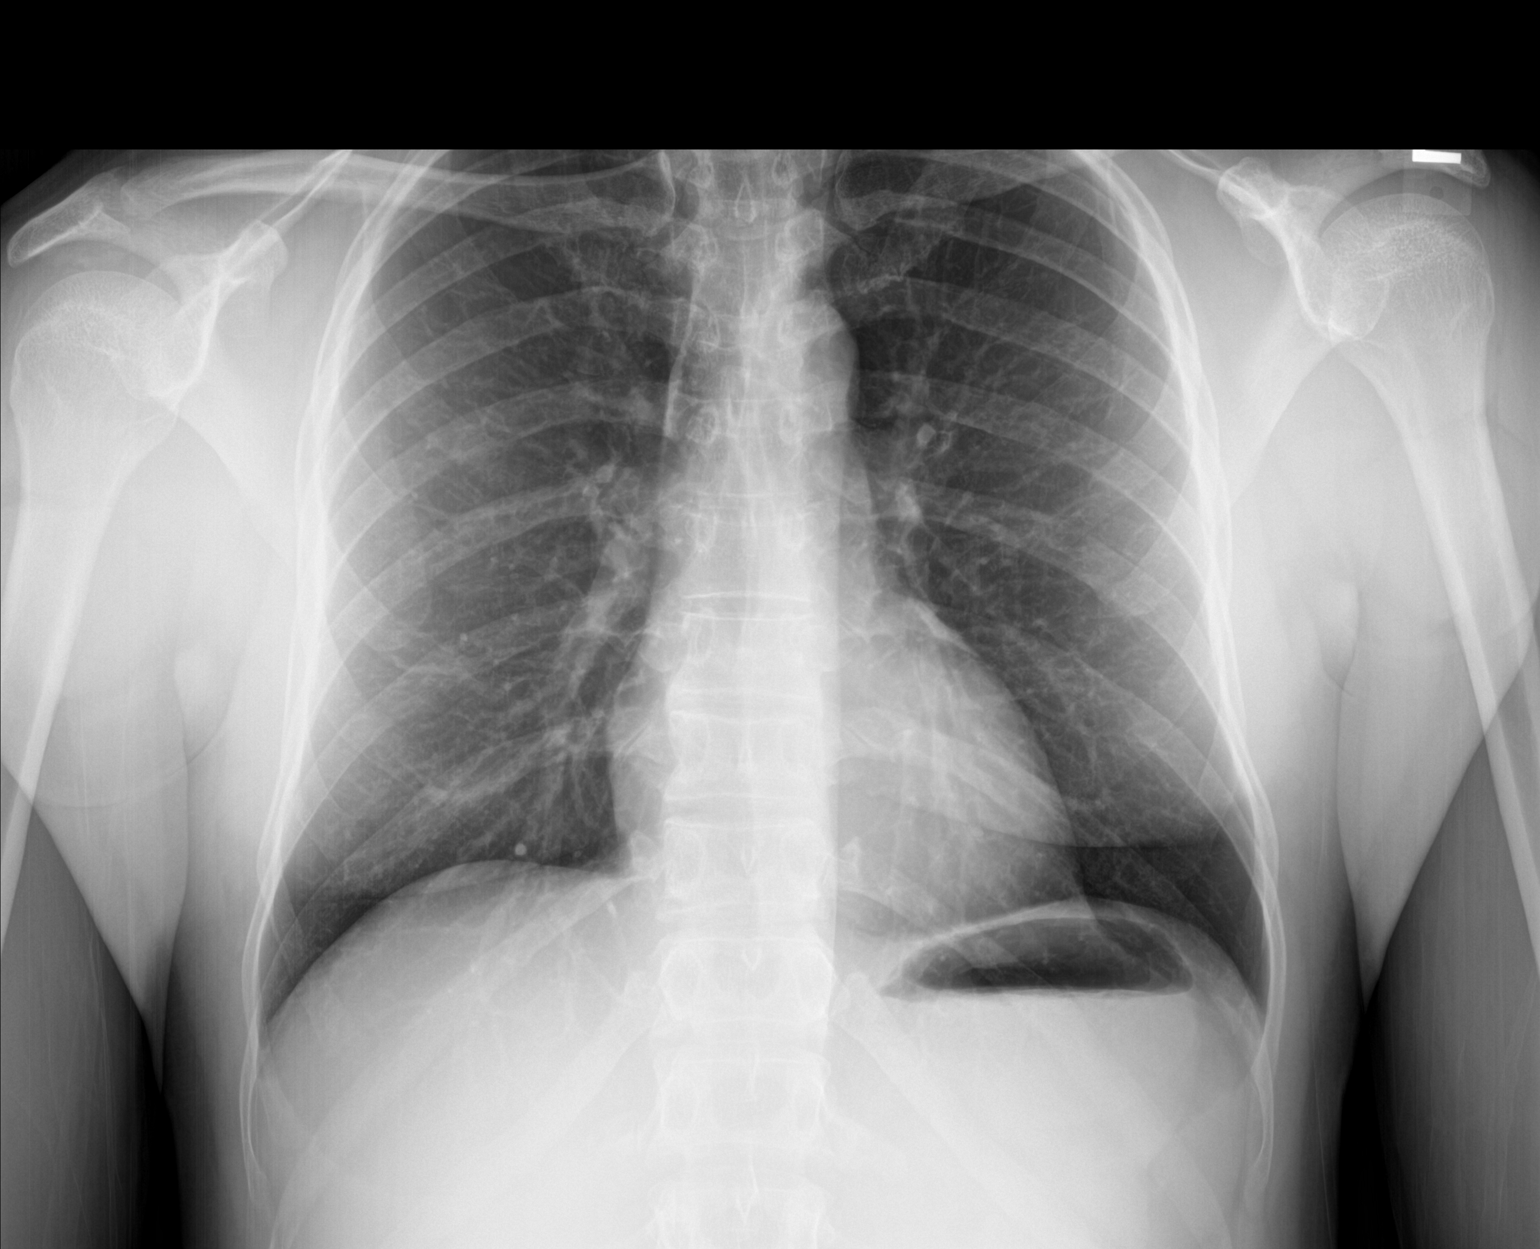

[chest lat]
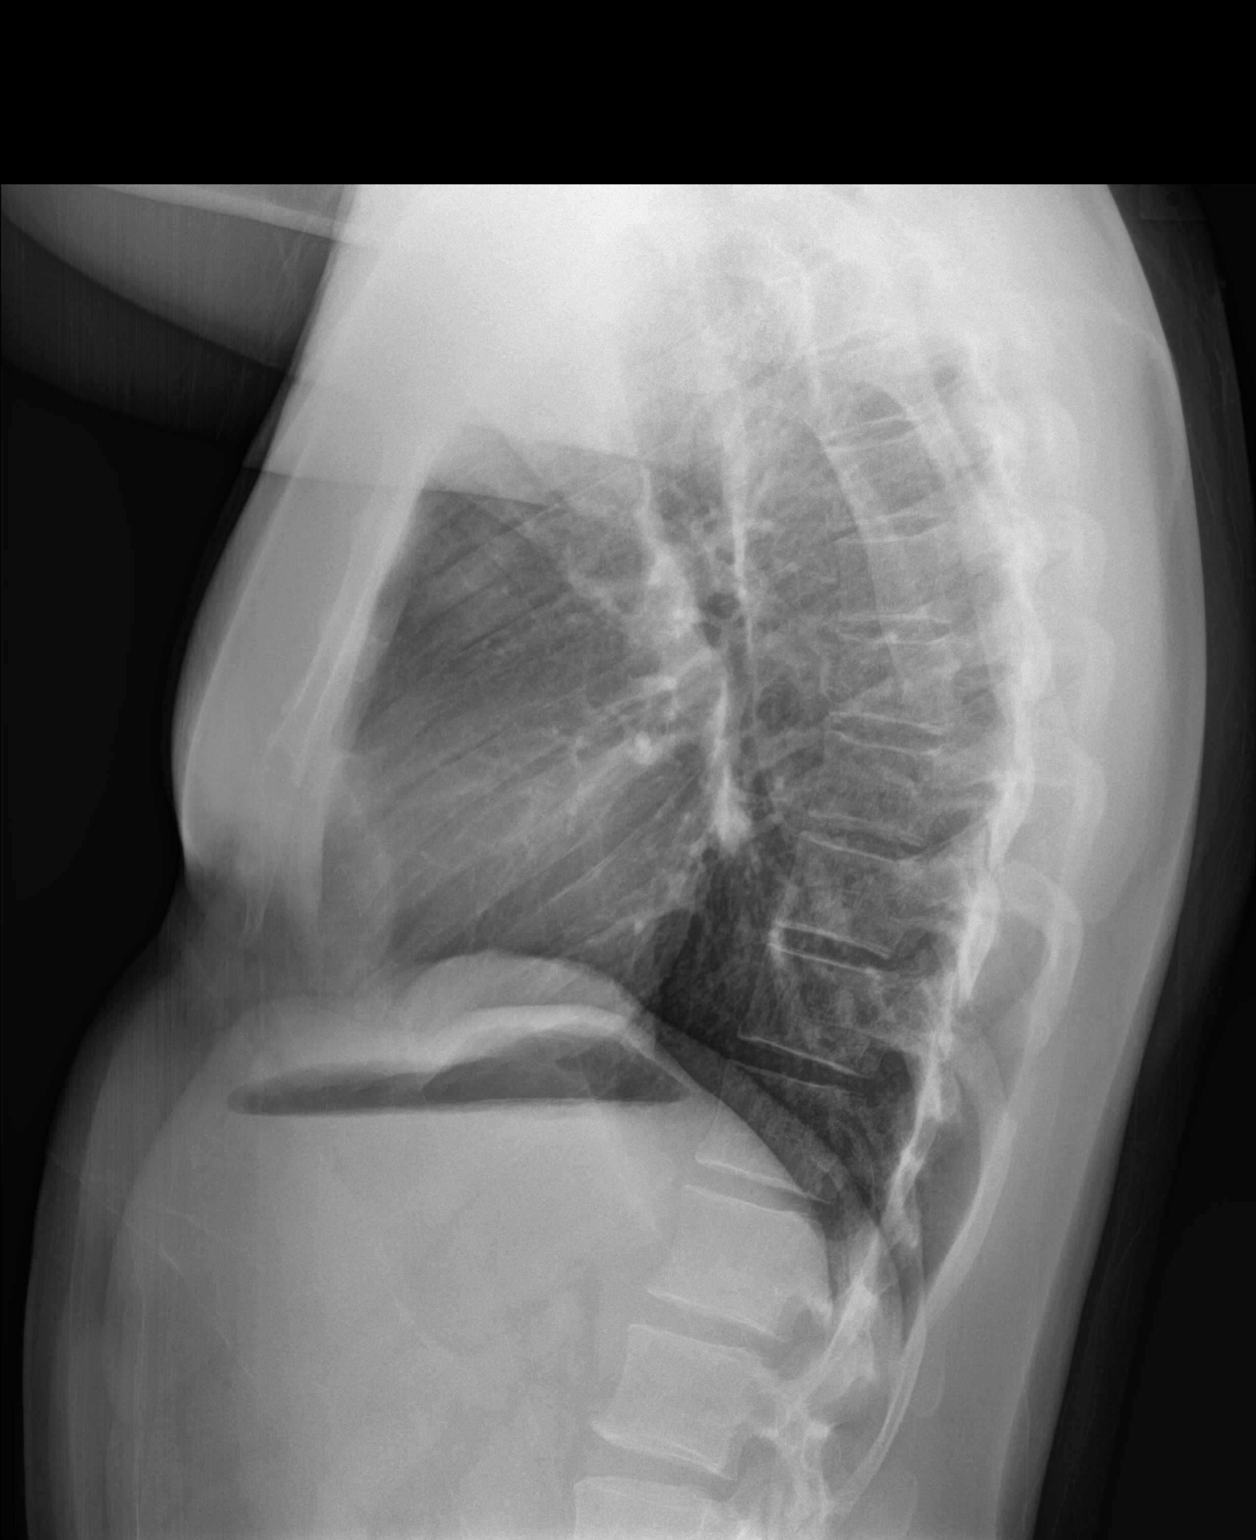

[2 of 2 positions shown; findings below may reference images not displayed]

FINDINGS: No consolidation, features of edema, pneumothorax, or effusion.
Pulmonary vascularity is normally distributed. The cardiomediastinal
contours are unremarkable. No acute osseous or soft tissue
abnormality.
IMPRESSION: No acute cardiopulmonary abnormality.

## 2021-05-20 ENCOUNTER — Ambulatory Visit: Payer: Self-pay

## 2021-05-20 ENCOUNTER — Other Ambulatory Visit: Payer: Self-pay

## 2021-05-20 ENCOUNTER — Ambulatory Visit (INDEPENDENT_AMBULATORY_CARE_PROVIDER_SITE_OTHER): Payer: Federal, State, Local not specified - PPO | Admitting: Family Medicine

## 2021-05-20 VITALS — BP 110/76 | HR 83 | Ht 68.0 in | Wt 172.2 lb

## 2021-05-20 DIAGNOSIS — M25511 Pain in right shoulder: Secondary | ICD-10-CM

## 2021-05-20 DIAGNOSIS — G8929 Other chronic pain: Secondary | ICD-10-CM

## 2021-05-20 NOTE — Progress Notes (Signed)
   I, Allen [name], LAT, ATC acting as a scribe for Allen Graham, MD.  Subjective:    CC: R shoulder pain  HPI: Pt is a 40 y/o male c/o chronic R shoulder pain that flared up over the last 2-3 months. Pt was previously seen by Allen [name] in 03/03/21. Pt locates pain to right shoulder superior aspect at [facility] joint and anterior shoulder.  Pain is worse with abduction and internal rotation.  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  At [facility] he had a subacromial injection that helped only a little.  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  Neck pain: no Radiates: no UE numbness/tingling: no UE weakness: no Aggravates: varies Treatments tried: ice, IBU, steroid injection  Dx imaging: 06/04/19 C-spine XR  Pertinent review of Systems: No fevers or chills  Relevant historical information: Prediabetes and dyslipidemia.   Objective:    Vitals:   05/20/21 1120  BP: 110/76  Pulse: 83  SpO2: 98%   General: Well Developed, well nourished, and in no acute distress.   MSK: Right shoulder normal-appearing Mildly tender palpation AC joint. Normal motion pain with abduction and internal rotation. Intact strength. Positive empty can test. Positive Hawkins and Neer's test. Positive crossover arm compression test. Negative Yergason's and speeds test. Pulses cap refill and sensation are intact distally.  Lab and Radiology Results  MRI right shoulder [name] reviewed as noted above. Report will be sent to scan.   Impression and Recommendations:    Assessment and Plan: 40 y.o. male with right shoulder pain.  Pain due to [facility] joint DJD and distal clavicle bone marrow edema and subacromial bursitis.  Pain did not improve with subacromial injection.   Discussed options.  Plan for physical therapy referral and check back in 6 weeks.  If not better would consider AC joint injection and if still not better would consider surgical referral for subacromial decompression and distal clavicle excision..  Carefully reviewed MRI findings and and explained with a shoulder model what is the problem and discussed treatment plan and options with Allen [name] who expressed understanding and agreement. Total encounter time 30 minutes including face-to-face time with the patient and, reviewing past medical record, and charting on the date of service.     PDMP not reviewed this encounter. Orders Placed This Encounter  Procedures   Ambulatory referral to Physical Therapy    Referral Priority:   Routine    Referral Type:   Physical Medicine    Referral Reason:   Specialty Services Required    Requested Specialty:   Physical Therapy    Number of Visits Requested:   1   No orders of the defined types were placed in this encounter.   Discussed warning signs or symptoms. Please see discharge instructions. Patient expresses understanding.   The above documentation has been reviewed and is accurate and complete Allen [name], M.D.

## 2021-05-20 NOTE — Patient Instructions (Signed)
Thank you for coming in today.   I've referred you to Physical Therapy.  Let us know if you don't hear from them in one week.   Please use Voltaren gel (Generic Diclofenac Gel) up to 4x daily for pain as needed.  This is available over-the-counter as both the name brand Voltaren gel and the generic diclofenac gel.   Return in 6 weeks.   If not better we can do more including injection.   If you can never get better there surgery that could help would be a   "Subacromial decompression and distal clavicle excision"

## 2021-05-31 ENCOUNTER — Ambulatory Visit: Payer: Federal, State, Local not specified - PPO | Admitting: Rehabilitative and Restorative Service Providers"

## 2021-06-01 ENCOUNTER — Ambulatory Visit (INDEPENDENT_AMBULATORY_CARE_PROVIDER_SITE_OTHER): Payer: Federal, State, Local not specified - PPO | Admitting: Rehabilitative and Restorative Service Providers"

## 2021-06-01 ENCOUNTER — Encounter: Payer: Self-pay | Admitting: Rehabilitative and Restorative Service Providers"

## 2021-06-01 ENCOUNTER — Other Ambulatory Visit: Payer: Self-pay

## 2021-06-01 DIAGNOSIS — M6281 Muscle weakness (generalized): Secondary | ICD-10-CM | POA: Diagnosis not present

## 2021-06-01 DIAGNOSIS — R293 Abnormal posture: Secondary | ICD-10-CM

## 2021-06-01 DIAGNOSIS — G8929 Other chronic pain: Secondary | ICD-10-CM

## 2021-06-01 DIAGNOSIS — M25511 Pain in right shoulder: Secondary | ICD-10-CM

## 2021-06-01 NOTE — Patient Instructions (Signed)
Access Code: [email or web address] URL: [email or web address] Date: 06/01/2021 Prepared by: Chyrel Masson  Exercises Sidelying Shoulder ER with Towel and Dumbbell - 1 x daily - 7 x weekly - 3 sets - 10 reps Shoulder External Rotation and Scapular Retraction with Resistance - 1 x daily - 7 x weekly - 3 sets - 10 reps Sleeper Stretch (Mirrored) - 2 x daily - 7 x weekly - 1 sets - 5 reps - 30 hold

## 2021-06-01 NOTE — Therapy (Addendum)
[facility] [address] [place], [place], [address] Phone: [phone]   Fax:  [phone]  Physical Therapy Evaluation /Discharge  Patient Details  Name: Allen [name] MRN: [id number] Date of Birth: 12-04-1980 Referring Provider (PT): Gregor Hams, MD   Encounter Date: 06/01/2021   PT End of Session - 06/01/21 1237     Visit Number 1    Number of Visits 20    Date for PT Re-Evaluation 08/10/21    Authorization Type [employer] $30    Progress Note Due on Visit 10    PT Start Time 1155    PT Stop Time 1227    PT Time Calculation (min) 32 min    Activity Tolerance Patient tolerated treatment well    Behavior During Therapy [facility] for tasks assessed/performed             Past Medical History:  Diagnosis Date   Allergies     Past Surgical History:  Procedure Laterality Date   None      There were no vitals filed for this visit.    Subjective Assessment - 06/01/21 1157     Subjective Pt. came to clinic c complaints of Rt shoulder pain, onset in June.  Saw MD and had injection without good improvement.  MRI was performed.  Pt. stated complaints on top of shoulder.  Pt. stated initial MD recommended surgery so he sought another opinion to current MD.    Pertinent History Injection in past with little help.    Limitations House hold activities;Lifting    Diagnostic tests MRI showing mild AC DJD and mild supraspinatus tendinopathy/sub acromial bursitiis    Patient Stated Goals Reduce pain    Currently in Pain? Yes    Pain Score 8    pain at worst   Pain Location Shoulder    Pain Orientation Right    Pain Descriptors / Indicators Sharp;Throbbing    Pain Type Chronic pain    Pain Onset More than a month ago    Pain Frequency Intermittent    Aggravating Factors  lying on Rt side, driving, running    Pain Relieving Factors icing helps some    Effect of Pain on Daily Activities Driving for work                [facility] PT  Assessment - 06/01/21 0001       Assessment   Medical Diagnosis M25.511,G89.29 (ICD-10-CM) - Chronic right shoulder pain    Referring Provider (PT) Gregor Hams, MD    Onset Date/Surgical Date 02/02/21   insidious onset   Hand Dominance Right      Precautions   Precautions None      Restrictions   Weight Bearing Restrictions No      Balance Screen   Has the patient fallen in the past 6 months No    Has the patient had a decrease in activity level because of a fear of falling?  No    Is the patient reluctant to leave their home because of a fear of falling?  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  Able to perform full range against gravity with no shrug noted.  Lt shoulder WFL    AROM Assessment Site Shoulder    Right/Left Shoulder Left;Right    Right Shoulder Flexion 160 Degrees   in supine   Right Shoulder ABduction 150 Degrees   in supine   Right Shoulder Internal Rotation 60 Degrees   in supine 90 deg abd   Right Shoulder External Rotation 100 Degrees   in supine 90 deg abduction     PROM   PROM Assessment Site Shoulder    Right/Left Shoulder Left;Right      Strength   Strength Assessment Site Shoulder;Elbow    Right/Left Shoulder Left;Right    Right Shoulder Flexion 4/5   17, 16 lbs   Right Shoulder ABduction 5/5   33, 39 lbs   Right Shoulder Internal Rotation 5/5    Right Shoulder External Rotation 4/5   c pain   Left Shoulder Flexion 5/5   33, 37 lbs   Left Shoulder Extension 5/5   42, 35 lbs   Left  Shoulder Internal Rotation 5/5    Left Shoulder External Rotation 5/5    Right/Left Elbow Right;Left    Right Elbow Flexion 5/5    Right Elbow Extension 5/5    Left Elbow Flexion 5/5    Left Elbow Extension 5/5      Palpation   Palpation comment Trigger point Rt infraspinatus noted      Special Tests   Other special tests (+) Painful arc Rt, (-) Shrug Rt, (-) Drop arm Rt                        Objective measurements completed on examination: See above findings.       [facility] PT Treatment/Exercise - 06/01/21 0001       Exercises   Exercises Other Exercises    Other Exercises  HEP instruction/performance c cues for techniques, handout provided.  Trial set performed of each for comprehension and symptom assessment.  Seated scapular retraction c bilateral ER, sleeper stretch, sidelying ER per handout      Manual Therapy   Manual therapy comments compression to Rt infraspinatus c Rt shoulder ER mobility, g3 Rt AC joint mobility                     PT Education - 06/01/21 1155     Education Details HEP, POC    Person(s) Educated Patient    Methods Explanation;Demonstration;Verbal cues;Handout    Comprehension Returned demonstration;Verbalized understanding              PT Short Term Goals - 06/01/21 1155       PT SHORT TERM GOAL #1   Title Patient will demonstrate independent use of home exercise program to maintain progress from in clinic treatments.    Time 3    Period Weeks    Status New    Target Date 06/22/21               PT Long Term Goals - 06/01/21 1155       PT LONG TERM GOAL #1   Title Patient will demonstrate/report pain at worst less than or equal to 2/10 to facilitate minimal limitation in daily activity secondary to pain  symptoms.    Time 10    Period Weeks    Status New    Target Date 08/10/21      PT LONG TERM GOAL #2   Title Patient will demonstrate independent use of home exercise program to facilitate  ability to maintain/progress functional gains from skilled physical therapy services.    Time 10    Period Weeks    Status New    Target Date 08/10/21      PT LONG TERM GOAL #3   Title Pt. will demonstrate FOTO outcome > or = 70% to indicated reduced disability due to condition.    Time 10    Period Weeks    Status New    Target Date 08/10/21      PT LONG TERM GOAL #4   Title Patient will demonstrate Rt [place] joint mobility WFL to facilitate usual self care, dressing, reaching overhead at PLOF s limitation due to symptoms.    Time 10    Period Weeks    Status New    Target Date 08/10/21      PT LONG TERM GOAL #5   Title Patient will demonstrate Rt UE MMT 5/5 throughout to facilitate usual lifting, carrying in functional activity to PLOF s limitation.    Time 10    Period Weeks    Status New    Target Date 08/10/21                    Plan - 06/01/21 1156     Clinical Impression Statement Patient is a 40 y.o. who comes to clinic with complaints of Rt shoulder pain with mobility, strength and movement coordination deficits that impair their ability to perform usual daily and recreational functional activities without increase difficulty/symptoms at this time.  Patient to benefit from skilled PT services to address impairments and limitations to improve to previous level of function without restriction secondary to condition.    Examination-Activity Limitations Sleep;Carry;Lift;Reach Overhead;Dressing    Examination-Participation Restrictions Community Activity;Driving;Occupation    Stability/Clinical Decision Making Stable/Uncomplicated    Clinical Decision Making Low    Rehab Potential Good    PT Frequency Other (comment)   1-2x/week   PT Duration Other (comment)   10 weeks   PT Treatment/Interventions ADLs/Self Care Home Management;Electrical Stimulation;Cryotherapy;Iontophoresis 70m/ml Dexamethasone;Moist Heat;Balance training;Therapeutic exercise;Therapeutic  activities;Functional mobility training;Stair training;Gait training;Ultrasound;Neuromuscular re-education;Patient/family education;Passive range of motion;Spinal Manipulations;Joint Manipulations;Dry needling;Taping;Manual techniques    PT Next Visit Plan Possible DN to infraspinatus, AC joint mobility, ER rotator strengthening    PT Home Exercise Plan [email or web address]    Consulted and Agree with Plan of Care Patient             Patient will benefit from skilled therapeutic intervention in order to improve the following deficits and impairments:  Impaired UE functional use, Pain, Decreased activity tolerance, Decreased strength, Decreased mobility, Impaired perceived functional ability, Improper body mechanics, Postural dysfunction, Impaired flexibility, Decreased coordination, Decreased range of motion, Decreased endurance  Visit Diagnosis: Chronic right shoulder pain  Muscle weakness (generalized)  Abnormal posture     Problem List Patient Active Problem List   Diagnosis Date Noted   Chronic right shoulder pain 04/11/2021   Dyslipidemia 04/11/2021   Prediabetes 04/11/2021   History of hypogonadism 04/11/2021   Gastroesophageal reflux disease with esophagitis 02/11/2019   MScot Jun PT, DPT, OCS, ATC 06/01/21  12:53 PM  PHYSICAL THERAPY DISCHARGE SUMMARY  Visits from Start of Care: 1  Current functional level related  to goals / functional outcomes: See note   Remaining deficits: See note   Education / Equipment: HEP   Patient agrees to discharge. Patient goals were not met. Patient is being discharged due to not returning since the last visit. Scot Jun, PT, DPT, OCS, ATC 08/24/21  3:27 PM       [facility] [address] [place], [place], [address] Phone: [phone]   Fax:  [phone]  Name: Raun Routh MRN: [id number] Date of Birth: 30-Mar-1981

## 2021-06-16 ENCOUNTER — Encounter: Payer: BC Managed Care – PPO | Admitting: Emergency Medicine

## 2021-06-16 ENCOUNTER — Encounter: Payer: Federal, State, Local not specified - PPO | Admitting: Rehabilitative and Restorative Service Providers"

## 2021-06-16 ENCOUNTER — Encounter: Payer: Self-pay | Admitting: Emergency Medicine

## 2021-06-20 DIAGNOSIS — Z113 Encounter for screening for infections with a predominantly sexual mode of transmission: Secondary | ICD-10-CM | POA: Diagnosis not present

## 2021-06-20 DIAGNOSIS — M7551 Bursitis of right shoulder: Secondary | ICD-10-CM | POA: Diagnosis not present

## 2021-06-20 DIAGNOSIS — J301 Allergic rhinitis due to pollen: Secondary | ICD-10-CM | POA: Diagnosis not present

## 2021-06-20 DIAGNOSIS — E782 Mixed hyperlipidemia: Secondary | ICD-10-CM | POA: Diagnosis not present

## 2021-06-20 DIAGNOSIS — E559 Vitamin D deficiency, unspecified: Secondary | ICD-10-CM | POA: Diagnosis not present

## 2021-06-21 ENCOUNTER — Encounter: Payer: Federal, State, Local not specified - PPO | Admitting: Emergency Medicine

## 2021-06-30 NOTE — Progress Notes (Deleted)
   I, Christoper Fabian, LAT, ATC, am serving as scribe for Dr. Clementeen Graham.  Allen [name] is a 39 y.o. male who presents to [facility] today for f/u chronic R shoulder pain due to [facility] joint DJD, distal clavicle bone marrow edema and subacromial bursitis per MRI. Pt was last seen by Dr. Denyse Amass on 05/20/21 and was referred to PT, completing 1 visit (canceling 2 visits). He was previously seen by Dr. Madelon Lips at [facility] and had diagnostic imaging and a steroid injection.  Today, pt reports  Dx imaging: R shoulder MRI at [facility]- June 2022; C-spine XR- 06/04/19  Pertinent review of systems: ***  Relevant historical information: ***   Exam:  There were no vitals taken for this visit. General: Well Developed, well nourished, and in no acute distress.   MSK: ***    Lab and Radiology Results No results found for this or any previous visit (from the past 72 hour(s)). No results found.     Assessment and Plan: 40 y.o. male with ***   PDMP not reviewed this encounter. No orders of the defined types were placed in this encounter.  No orders of the defined types were placed in this encounter.    Discussed warning signs or symptoms. Please see discharge instructions. Patient expresses understanding.   ***

## 2021-07-01 ENCOUNTER — Ambulatory Visit: Payer: Federal, State, Local not specified - PPO | Admitting: Family Medicine

## 2021-07-04 ENCOUNTER — Ambulatory Visit (INDEPENDENT_AMBULATORY_CARE_PROVIDER_SITE_OTHER): Payer: Federal, State, Local not specified - PPO | Admitting: Emergency Medicine

## 2021-07-04 ENCOUNTER — Encounter: Payer: Self-pay | Admitting: Emergency Medicine

## 2021-07-04 ENCOUNTER — Other Ambulatory Visit: Payer: Self-pay

## 2021-07-04 VITALS — BP 116/60 | HR 76 | Ht 68.0 in | Wt 169.0 lb

## 2021-07-04 DIAGNOSIS — Z0001 Encounter for general adult medical examination with abnormal findings: Secondary | ICD-10-CM

## 2021-07-04 DIAGNOSIS — G8929 Other chronic pain: Secondary | ICD-10-CM

## 2021-07-04 DIAGNOSIS — M25511 Pain in right shoulder: Secondary | ICD-10-CM

## 2021-07-04 DIAGNOSIS — E785 Hyperlipidemia, unspecified: Secondary | ICD-10-CM

## 2021-07-04 DIAGNOSIS — R7303 Prediabetes: Secondary | ICD-10-CM | POA: Diagnosis not present

## 2021-07-04 DIAGNOSIS — Z0000 Encounter for general adult medical examination without abnormal findings: Secondary | ICD-10-CM

## 2021-07-04 NOTE — Patient Instructions (Signed)
Health Maintenance, Male Adopting a healthy lifestyle and getting preventive care are important in promoting health and wellness. Ask your health care provider about: The right schedule for you to have regular tests and exams. Things you can do on your own to prevent diseases and keep yourself healthy. What should I know about diet, weight, and exercise? Eat a healthy diet  Eat a diet that includes plenty of vegetables, fruits, low-fat dairy products, and lean protein. Do not eat a lot of foods that are high in solid fats, added sugars, or sodium. Maintain a healthy weight Body mass index (BMI) is a measurement that can be used to identify possible weight problems. It estimates body fat based on height and weight. Your health care provider can help determine your BMI and help you achieve or maintain a healthy weight. Get regular exercise Get regular exercise. This is one of the most important things you can do for your health. Most adults should: Exercise for at least 150 minutes each week. The exercise should increase your heart rate and make you sweat (moderate-intensity exercise). Do strengthening exercises at least twice a week. This is in addition to the moderate-intensity exercise. Spend less time sitting. Even light physical activity can be beneficial. Watch cholesterol and blood lipids Have your blood tested for lipids and cholesterol at 38 years of age, then have this test every 5 years. You may need to have your cholesterol levels checked more often if: Your lipid or cholesterol levels are high. You are older than 38 years of age. You are at high risk for heart disease. What should I know about cancer screening? Many types of cancers can be detected early and may often be prevented. Depending on your health history and family history, you may need to have cancer screening at various ages. This may include screening for: Colorectal cancer. Prostate cancer. Skin cancer. Lung  cancer. What should I know about heart disease, diabetes, and high blood pressure? Blood pressure and heart disease High blood pressure causes heart disease and increases the risk of stroke. This is more likely to develop in people who have high blood pressure readings, are of African descent, or are overweight. Talk with your health care provider about your target blood pressure readings. Have your blood pressure checked: Every 3-5 years if you are 18-39 years of age. Every year if you are 40 years old or older. If you are between the ages of 65 and 75 and are a current or former smoker, ask your health care provider if you should have a one-time screening for abdominal aortic aneurysm (AAA). Diabetes Have regular diabetes screenings. This checks your fasting blood sugar level. Have the screening done: Once every three years after age 45 if you are at a normal weight and have a low risk for diabetes. More often and at a younger age if you are overweight or have a high risk for diabetes. What should I know about preventing infection? Hepatitis B If you have a higher risk for hepatitis B, you should be screened for this virus. Talk with your health care provider to find out if you are at risk for hepatitis B infection. Hepatitis C Blood testing is recommended for: Everyone born from 1945 through 1965. Anyone with known risk factors for hepatitis C. Sexually transmitted infections (STIs) You should be screened each year for STIs, including gonorrhea and chlamydia, if: You are sexually active and are younger than 38 years of age. You are older than 38 years   of age and your health care provider tells you that you are at risk for this type of infection. Your sexual activity has changed since you were last screened, and you are at increased risk for chlamydia or gonorrhea. Ask your health care provider if you are at risk. Ask your health care provider about whether you are at high risk for HIV.  Your health care provider may recommend a prescription medicine to help prevent HIV infection. If you choose to take medicine to prevent HIV, you should first get tested for HIV. You should then be tested every 3 months for as long as you are taking the medicine. Follow these instructions at home: Lifestyle Do not use any products that contain nicotine or tobacco, such as cigarettes, e-cigarettes, and chewing tobacco. If you need help quitting, ask your health care provider. Do not use street drugs. Do not share needles. Ask your health care provider for help if you need support or information about quitting drugs. Alcohol use Do not drink alcohol if your health care provider tells you not to drink. If you drink alcohol: Limit how much you have to 0-2 drinks a day. Be aware of how much alcohol is in your drink. In the [place] one drink equals one 12 oz bottle of beer (355 mL), one 5 oz glass of wine (148 mL), or one 1 oz glass of hard liquor (44 mL). General instructions Schedule regular health, dental, and eye exams. Stay current with your vaccines. Tell your health care provider if: You often feel depressed. You have ever been abused or do not feel safe at home. Summary Adopting a healthy lifestyle and getting preventive care are important in promoting health and wellness. Follow your health care provider's instructions about healthy diet, exercising, and getting tested or screened for diseases. Follow your health care provider's instructions on monitoring your cholesterol and blood pressure. This information is not intended to replace advice given to you by your health care provider. Make sure you discuss any questions you have with your health care provider. Document Revised: 10/29/2020 Document Reviewed: 08/14/2018 Elsevier Patient Education  2022 Elsevier Inc[employer]  

## 2021-07-04 NOTE — Progress Notes (Signed)
Allen [name] 40 y.o.   Chief Complaint  Patient presents with   Annual Exam    HISTORY OF PRESENT ILLNESS: This is a 40 y.o. male here for annual exam. Healthy male with a healthy lifestyle. Works at the [employer], mail carrier. Non-smoker. Chronic pain to right shoulder.  Has seen orthopedist in the past and recently saw sports medicine Dr. Georgina Snell. Still not making a whole lot of progress.  He will follow-up with him. Blood work done on 04/11/2021 reviewed with patient.  Showed prediabetes with hemoglobin A1c of 6.1 and elevated cholesterol. Total testosterone levels over 400.  Rest of labs within normal limits. No complaints or any other medical concerns today.  HPI   Prior to Admission medications   Medication Sig Start Date End Date Taking? Authorizing Provider  meloxicam (MOBIC) 15 MG tablet Take 1 tablet (15 mg total) by mouth daily. 04/11/21  Yes Dula Havlik, Ines Bloomer, MD  Multiple Vitamin (MULTIVITAMIN) tablet Take 1 tablet by mouth as needed.   Yes [provider]  VITAMIN D PO Take by mouth daily.   Yes [provider]    No Known Allergies  Patient Active Problem List   Diagnosis Date Noted   Chronic right shoulder pain 04/11/2021   Dyslipidemia 04/11/2021   Prediabetes 04/11/2021   History of hypogonadism 04/11/2021   Gastroesophageal reflux disease with esophagitis 02/11/2019    Past Medical History:  Diagnosis Date   Allergies     Past Surgical History:  Procedure Laterality Date   None      Social History   Socioeconomic History   Marital status: Married    Spouse name: Not on file   Number of children: 0   Years of education: Not on file   Highest education level: Not on file  Occupational History   Occupation: Delivery  Tobacco Use   Smoking status: Never   Smokeless tobacco: Never  Vaping Use   Vaping Use: Never used  Substance and Sexual Activity   Alcohol use: Yes    Comment: Occassionlly    Drug use: No     Comment: No marijuana   Sexual activity: Yes  Other Topics Concern   Not on file  Social History Narrative   Not on file   Social Determinants of Health   Financial Resource Strain: Not on file  Food Insecurity: Not on file  Transportation Needs: Not on file  Physical Activity: Not on file  Stress: Not on file  Social Connections: Not on file  Intimate Partner Violence: Not on file    Family History  Problem Relation Age of Onset   Diabetes Mother    High Cholesterol Mother    Diabetes Father    High Cholesterol Father    Colon cancer Neg Hx    Esophageal cancer Neg Hx    Liver cancer Neg Hx    Pancreatic cancer Neg Hx    Rectal cancer Neg Hx    Stomach cancer Neg Hx      Review of Systems  Constitutional: Negative.  Negative for chills and fever.  HENT: Negative.  Negative for congestion and sore throat.   Respiratory: Negative.  Negative for cough and shortness of breath.   Cardiovascular: Negative.  Negative for chest pain and palpitations.  Gastrointestinal: Negative.  Negative for abdominal pain, diarrhea, nausea and vomiting.  Genitourinary: Negative.  Negative for dysuria and hematuria.  Musculoskeletal:  Positive for joint pain (Right shoulder).  Skin: Negative.  Negative for rash.  Neurological: Negative.  Negative for dizziness and headaches.  All other systems reviewed and are negative.  Today's Vitals   07/04/21 0949  BP: 116/60  Pulse: 76  SpO2: 96%  Weight: 169 lb (76.7 kg)  Height: 5\' 8"  (1.727 m)   Body mass index is 25.7 kg/m. Wt Readings from Last 3 Encounters:  07/04/21 169 lb (76.7 kg)  05/20/21 172 lb 3.2 oz (78.1 kg)  11/10/20 171 lb (77.6 kg)    Physical Exam Vitals reviewed.  Constitutional:      Appearance: Normal appearance.  HENT:     Head: Normocephalic.     Right Ear: Tympanic membrane, ear canal and external ear normal.     Left Ear: Tympanic membrane, ear canal and external ear normal.     Mouth/Throat:     Mouth:  Mucous membranes are moist.     Pharynx: Oropharynx is clear.  Eyes:     Extraocular Movements: Extraocular movements intact.     Conjunctiva/sclera: Conjunctivae normal.     Pupils: Pupils are equal, round, and reactive to light.  Cardiovascular:     Rate and Rhythm: Normal rate and regular rhythm.     Pulses: Normal pulses.     Heart sounds: Normal heart sounds.  Pulmonary:     Effort: Pulmonary effort is normal.     Breath sounds: Normal breath sounds.  Abdominal:     General: Bowel sounds are normal. There is no distension.     Palpations: Abdomen is soft.     Tenderness: There is no abdominal tenderness.  Musculoskeletal:        General: Normal range of motion.     Cervical back: Normal range of motion and neck supple. No tenderness.     Right lower leg: No edema.     Left lower leg: No edema.     Comments: Painful range of motion right shoulder  Lymphadenopathy:     Cervical: No cervical adenopathy.  Skin:    General: Skin is warm and dry.     Capillary Refill: Capillary refill takes less than 2 seconds.  Neurological:     General: No focal deficit present.     Mental Status: He is alert and oriented to person, place, and time.  Psychiatric:        Mood and Affect: Mood normal.        Behavior: Behavior normal.     ASSESSMENT & PLAN: Problem List Items Addressed This Visit       Other   Chronic right shoulder pain   Dyslipidemia   Prediabetes   Other Visit Diagnoses     Routine general medical examination at a health care facility    -  Primary       Modifiable risk factors discussed with patient. Anticipatory guidance according to age provided. The following topics were also discussed: Social Determinants of Health Smoking.  Non-smoker Diet and nutrition and need to decrease amount of daily carbohydrate intake due to prediabetes and dyslipidemia Benefits of exercise Cancer family history review Vaccinations recommendations Cardiovascular risk  assessment Mental health including depression and anxiety Fall and accident prevention Chronic right shoulder pain and need to follow-up with orthopedist or sports medicine  Patient Instructions  Health Maintenance, Male Adopting a healthy lifestyle and getting preventive care are important in promoting health and wellness. Ask your health care provider about: The right schedule for you to have regular tests and exams. Things you can do on your own to prevent diseases  and keep yourself healthy. What should I know about diet, weight, and exercise? Eat a healthy diet  Eat a diet that includes plenty of vegetables, fruits, low-fat dairy products, and lean protein. Do not eat a lot of foods that are high in solid fats, added sugars, or sodium. Maintain a healthy weight Body mass index (BMI) is a measurement that can be used to identify possible weight problems. It estimates body fat based on height and weight. Your health care provider can help determine your BMI and help you achieve or maintain a healthy weight. Get regular exercise Get regular exercise. This is one of the most important things you can do for your health. Most adults should: Exercise for at least 150 minutes each week. The exercise should increase your heart rate and make you sweat (moderate-intensity exercise). Do strengthening exercises at least twice a week. This is in addition to the moderate-intensity exercise. Spend less time sitting. Even light physical activity can be beneficial. Watch cholesterol and blood lipids Have your blood tested for lipids and cholesterol at 38 years of age, then have this test every 5 years. You may need to have your cholesterol levels checked more often if: Your lipid or cholesterol levels are high. You are older than 38 years of age. You are at high risk for heart disease. What should I know about cancer screening? Many types of cancers can be detected early and may often be prevented.  Depending on your health history and family history, you may need to have cancer screening at various ages. This may include screening for: Colorectal cancer. Prostate cancer. Skin cancer. Lung cancer. What should I know about heart disease, diabetes, and high blood pressure? Blood pressure and heart disease High blood pressure causes heart disease and increases the risk of stroke. This is more likely to develop in people who have high blood pressure readings, are of African descent, or are overweight. Talk with your health care provider about your target blood pressure readings. Have your blood pressure checked: Every 3-5 years if you are 21-90 years of age. Every year if you are 33 years old or older. If you are between the ages of 78 and 9 and are a current or former smoker, ask your health care provider if you should have a one-time screening for abdominal aortic aneurysm (AAA). Diabetes Have regular diabetes screenings. This checks your fasting blood sugar level. Have the screening done: Once every three years after age 53 if you are at a normal weight and have a low risk for diabetes. More often and at a younger age if you are overweight or have a high risk for diabetes. What should I know about preventing infection? Hepatitis B If you have a higher risk for hepatitis B, you should be screened for this virus. Talk with your health care provider to find out if you are at risk for hepatitis B infection. Hepatitis C Blood testing is recommended for: Everyone born from [birth year] through 1965. Anyone with known risk factors for hepatitis C. Sexually transmitted infections (STIs) You should be screened each year for STIs, including gonorrhea and chlamydia, if: You are sexually active and are younger than 38 years of age. You are older than 38 years of age and your health care provider tells you that you are at risk for this type of infection. Your sexual activity has changed since you were  last screened, and you are at increased risk for chlamydia or gonorrhea. Ask your health care provider  if you are at risk. Ask your health care provider about whether you are at high risk for HIV. Your health care provider may recommend a prescription medicine to help prevent HIV infection. If you choose to take medicine to prevent HIV, you should first get tested for HIV. You should then be tested every 3 months for as long as you are taking the medicine. Follow these instructions at home: Lifestyle Do not use any products that contain nicotine or tobacco, such as cigarettes, e-cigarettes, and chewing tobacco. If you need help quitting, ask your health care provider. Do not use street drugs. Do not share needles. Ask your health care provider for help if you need support or information about quitting drugs. Alcohol use Do not drink alcohol if your health care provider tells you not to drink. If you drink alcohol: Limit how much you have to 0-2 drinks a day. Be aware of how much alcohol is in your drink. In the [place] one drink equals one 12 oz bottle of beer (355 mL), one 5 oz glass of wine (148 mL), or one 1 oz glass of hard liquor (44 mL). General instructions Schedule regular health, dental, and eye exams. Stay current with your vaccines. Tell your health care provider if: You often feel depressed. You have ever been abused or do not feel safe at home. Summary Adopting a healthy lifestyle and getting preventive care are important in promoting health and wellness. Follow your health care provider's instructions about healthy diet, exercising, and getting tested or screened for diseases. Follow your health care provider's instructions on monitoring your cholesterol and blood pressure. This information is not intended to replace advice given to you by your health care provider. Make sure you discuss any questions you have with your health care provider. Document Revised: 10/29/2020 Document  Reviewed: 08/14/2018 Elsevier Patient Education  2022 [place], MD [facility]

## 2021-07-15 DIAGNOSIS — M89511 Osteolysis, right shoulder: Secondary | ICD-10-CM | POA: Diagnosis not present

## 2021-08-08 DIAGNOSIS — N3943 Post-void dribbling: Secondary | ICD-10-CM | POA: Diagnosis not present

## 2021-08-08 DIAGNOSIS — N503 Cyst of epididymis: Secondary | ICD-10-CM | POA: Diagnosis not present

## 2021-08-08 DIAGNOSIS — R3915 Urgency of urination: Secondary | ICD-10-CM | POA: Diagnosis not present

## 2021-08-08 DIAGNOSIS — R350 Frequency of micturition: Secondary | ICD-10-CM | POA: Diagnosis not present

## 2021-09-16 DIAGNOSIS — N3943 Post-void dribbling: Secondary | ICD-10-CM | POA: Diagnosis not present

## 2021-09-16 DIAGNOSIS — N503 Cyst of epididymis: Secondary | ICD-10-CM | POA: Diagnosis not present

## 2021-09-16 DIAGNOSIS — N5201 Erectile dysfunction due to arterial insufficiency: Secondary | ICD-10-CM | POA: Diagnosis not present

## 2021-09-16 DIAGNOSIS — R350 Frequency of micturition: Secondary | ICD-10-CM | POA: Diagnosis not present

## 2021-09-29 DIAGNOSIS — I861 Scrotal varices: Secondary | ICD-10-CM | POA: Diagnosis not present

## 2021-09-29 DIAGNOSIS — E291 Testicular hypofunction: Secondary | ICD-10-CM | POA: Diagnosis not present

## 2021-09-29 DIAGNOSIS — N5201 Erectile dysfunction due to arterial insufficiency: Secondary | ICD-10-CM | POA: Diagnosis not present

## 2021-10-06 DIAGNOSIS — E291 Testicular hypofunction: Secondary | ICD-10-CM | POA: Diagnosis not present

## 2021-10-24 DIAGNOSIS — N5201 Erectile dysfunction due to arterial insufficiency: Secondary | ICD-10-CM | POA: Diagnosis not present

## 2022-03-03 ENCOUNTER — Encounter: Payer: Self-pay | Admitting: Internal Medicine

## 2022-03-03 ENCOUNTER — Ambulatory Visit (INDEPENDENT_AMBULATORY_CARE_PROVIDER_SITE_OTHER): Payer: Federal, State, Local not specified - PPO | Admitting: Internal Medicine

## 2022-03-03 VITALS — BP 118/68 | HR 82 | Temp 98.0°F | Resp 16 | Ht 67.52 in | Wt 117.0 lb

## 2022-03-03 DIAGNOSIS — J3089 Other allergic rhinitis: Secondary | ICD-10-CM

## 2022-03-03 DIAGNOSIS — H1013 Acute atopic conjunctivitis, bilateral: Secondary | ICD-10-CM

## 2022-03-03 DIAGNOSIS — H1010 Acute atopic conjunctivitis, unspecified eye: Secondary | ICD-10-CM

## 2022-03-03 MED ORDER — RYALTRIS 665-25 MCG/ACT NA SUSP: 2.0000 | Freq: Every day | NASAL | 5 refills | Status: DC

## 2022-03-03 MED ORDER — OLOPATADINE HCL 0.2 % OP SOLN: 1.0000 [drp] | Freq: Two times a day (BID) | OPHTHALMIC | 2 refills | Status: DC

## 2022-03-03 MED ORDER — MONTELUKAST SODIUM 10 MG PO TABS: 10.0000 mg | ORAL_TABLET | Freq: Every day | ORAL | 1 refills | Status: DC

## 2022-03-03 NOTE — Progress Notes (Signed)
New Patient Note  RE: Allen [name] MRN: [id number] DOB: 1981/01/08 Date of Office Visit: 03/03/2022  Consult requested by: Georgina Quint, * Primary care provider: Georgina Quint, MD  Chief Complaint: Allergy Testing (Environmental), Other (Ear nose and eyes are itchy stuffy nose, and sneezing when he cuts grass), and Allergic Rhinitis   History of Present Illness: I had the pleasure of seeing Allen [name] for initial evaluation at the [facility] on 03/03/2022. He is a 41 y.o. male, who is referred here by Georgina Quint, MD for the evaluation of rhinitis.  History obtained from patient .  Chronic rhinitis: started since he moved here from [place]  Symptoms include:  itchy throat , nasal congestion, rhinorrhea, post nasal drainage, sneezing, watery eyes, itchy eyes, and itchy nose  Occurs  season changes   improves in the winter  Potential triggers: denies animal triggers, pollen season  Treatments tried: OTC antihistamines, does not tolerate nose sprays due to them inducing sneezing and headaches  Previous allergy testing: no History of reflux/heartburn: no History of chronic sinusitis or sinus surgery: no Nonallergic triggers:  denies      Assessment and Plan: Allen [name] is a 41 y.o. male with: Other allergic rhinitis - Plan: Allergy Test  Seasonal allergic conjunctivitis - Plan: Allergy Test Plan: Patient Instructions  Allergic  Rhinitis: not well  controlled  - Testing today showed positive to grass pollen, tree pollen and weed pollen  - Copy of test results provided.  - Avoidance measures provided. - Start taking:  Ryaltris 2 sprays per nostril daily (sample given in clinic today) , Zyrtec (cetirizine) 10mg  tablet once daily, and Singulair (montelukast) 10mg  daily - You can use an extra dose of the antihistamine, if needed, for breakthrough symptoms.  - Consider nasal saline rinses 1-2 times daily to remove allergens from the nasal  cavities as well as help with mucous clearance (this is especially helpful to do before the nasal sprays are given) - Consider allergy shots as a means of long-term control and can reduce lifetime use of medications  - Allergy shots "re-train" and "reset" the immune system to ignore environmental allergens and decrease the resulting immune response to those allergens (sneezing, itchy watery eyes, runny nose, nasal congestion, etc).    - Allergy shots improve symptoms in 75-85%  - Allergy shots are the only potential permanent and disease modifying option  - We can discuss more at the next appointment if the medications are not working for you.  Allergic Conjunctivitis - Continue Allergen avoidance as instructed - Avoiding rubbing eyes, if irritated use a wet wash cloth to wipe allergen out of eyes  - Start Allergy Eye drops: great options include Pataday (Olopatadine) or Zaditor (ketotifen) for eye symptoms daily as needed-both sold over the counter if not covered by insurance.   -Avoid eye drops that say red eye relief as they may contain medications that dry out your eyes. - Consider allergen immunotherapy if symptoms worsen or you desire to reduce lifetime use of medications.    Follow up: 6 months   Thank you so much for letting me partake in your care today.  Don't hesitate to reach out if you have any additional concerns!  , MD  [facility] [place] [place]  Reducing Pollen Exposure  The American Academy of Allergy, Asthma and Immunology suggests the following steps to reduce your exposure to pollen during allergy seasons.    Do not hang  sheets or clothing out to dry; pollen may collect on these items. Do not mow lawns or spend time around freshly cut grass; mowing stirs up pollen. Keep windows closed at night.  Keep car windows closed while driving. Minimize morning activities outdoors, a time when pollen counts are usually at their highest. Stay  indoors as much as possible when pollen counts or humidity is high and on windy days when pollen tends to remain in the air longer. Use air conditioning when possible.  Many air conditioners have filters that trap the pollen spores. Use a HEPA room air filter to remove pollen form the indoor air you breathe.  No follow-ups on file.  Meds ordered this encounter  Medications   montelukast (SINGULAIR) 10 MG tablet    Sig: Take 1 tablet (10 mg total) by mouth at bedtime.    Dispense:  90 tablet    Refill:  1   Olopatadine HCl 0.2 % SOLN    Sig: Apply 1 drop to eye 2 (two) times daily.    Dispense:  2.5 mL    Refill:  2   Olopatadine-Mometasone (RYALTRIS) 665-25 MCG/ACT SUSP    Sig: Place 2 sprays into the nose daily.    Dispense:  29 g    Refill:  5   Lab Orders  No laboratory test(s) ordered today    Other allergy screening: Asthma: no Rhino conjunctivitis: yes Food allergy: no Medication allergy: no Hymenoptera allergy: no Urticaria: no Eczema:no History of recurrent infections suggestive of immunodeficency: no  Diagnostics: Skin Testing: Environmental allergy panel.  Results interpreted by myself and discussed with patient/family.  Airborne Adult Perc - 03/03/22 1436     Time Antigen Placed 1441    Allergen Manufacturer Waynette Buttery    Location Back    Number of Test 59    1. Control-Buffer 50% Glycerol Negative    2. Control-Histamine 1 mg/ml 4+    3. Albumin saline Negative    4. Bahia Negative    5. French Southern Territ[place] Negative    6. Johnson 4+    7. Kentucky Blue 4+    8. Meadow Fescue 4+    9. Perennial Rye 4+    10. Sweet Vernal 4+    11. Timothy 4+    12. Cocklebur Negative    13. Burweed Marshelder 3+    14. Ragweed, short Negative    15. Ragweed, Giant Negative    16. Plantain,  English 4+    17. Lamb's Quarters Negative    18. Sheep Sorrell 3+    19. Rough Pigweed 3+    20. Marsh Elder, Rough Negative    21. Mugwort, Common 3+    22. Ash mix 3+    23. Birch  mix 3+    24. Beech American Negative    25. Box, Elder Negative    26. Cedar, red 3+    27. Cottonwood, Eastern 3+    28. Elm mix Negative    29. Hickory Negative    30. Maple mix Negative    31. Oak, Guinea-Bissau mix Negative    32. Pecan Pollen Negative    33. Pine mix Negative    35. Walnut, Black Pollen Negative    36. Alternaria alternata Negative    37. Cladosporium Herbarum Negative    38. Aspergillus mix Negative    39. Penicillium mix Negative    40. Bipolaris sorokiniana (Helminthosporium) Negative    41. Drechslera spicifera (Curvularia) Negative    42. Mucor plumbeus  Negative    43. Fusarium moniliforme Negative    44. Aureobasidium pullulans (pullulara) Negative    45. Rhizopus oryzae Negative    46. Botrytis cinera Negative    47. Epicoccum nigrum Negative    48. Phoma betae Negative    49. Candida Albicans Negative    50. Trichophyton mentagrophytes Negative    51. Mite, D Farinae  5,000 AU/ml Negative    52. Mite, D Pteronyssinus  5,000 AU/ml Negative    53. Cat Hair 10,000 BAU/ml Negative    54.  Dog Epithelia Negative    55. Mixed Feathers Negative    56. Horse Epithelia Negative    57. Cockroach, German Negative    58. Mouse Negative    59. Tobacco Leaf Negative             Past Medical History: Patient Active Problem List   Diagnosis Date Noted   Chronic right shoulder pain 04/11/2021   Dyslipidemia 04/11/2021   Prediabetes 04/11/2021   History of hypogonadism 04/11/2021   Gastroesophageal reflux disease with esophagitis 02/11/2019   Past Medical History:  Diagnosis Date   Allergies    Past Surgical History: Past Surgical History:  Procedure Laterality Date   None     Medication List:  Current Outpatient Medications  Medication Sig Dispense Refill   meloxicam (MOBIC) 15 MG tablet Take 1 tablet (15 mg total) by mouth daily. 30 tablet 0   montelukast (SINGULAIR) 10 MG tablet Take 1 tablet (10 mg total) by mouth at bedtime. 90 tablet 1    Multiple Vitamin (MULTIVITAMIN) tablet Take 1 tablet by mouth as needed.     Olopatadine HCl 0.2 % SOLN Apply 1 drop to eye 2 (two) times daily. 2.5 mL 2   Olopatadine-Mometasone (RYALTRIS) 665-25 MCG/ACT SUSP Place 2 sprays into the nose daily. 29 g 5   VITAMIN D PO Take by mouth daily.     No current facility-administered medications for this visit.   Allergies: No Known Allergies Social History: Social History   Socioeconomic History   Marital status: Married    Spouse name: Not on file   Number of children: 0   Years of education: Not on file   Highest education level: Not on file  Occupational History   Occupation: Delivery  Tobacco Use   Smoking status: Never    Passive exposure: Past   Smokeless tobacco: Never  Vaping Use   Vaping Use: Never used  Substance and Sexual Activity   Alcohol use: Yes    Comment: Occassionlly    Drug use: No    Comment: No marijuana   Sexual activity: Yes  Other Topics Concern   Not on file  Social History Narrative   Not on file   Social Determinants of Health   Financial Resource Strain: Not on file  Food Insecurity: Not on file  Transportation Needs: Not on file  Physical Activity: Not on file  Stress: Not on file  Social Connections: Not on file   Lives in a single-family home that is 39 years old.  There are no roaches in the house and bed is 2 feet off the floor.  They use dust mite precautions on bed and pillows.  He is not exposed to fumes, chemicals or dust through job or hobby.  There is no HEPA filter in the home and home is not near an interstate industrial area. Smoking: No exposure Occupation: Works for [employer] History: Immunologist in  the house: no Carpet in the family room: yes Carpet in the bedroom: yes Heating: electric Cooling: central Pet: no  Family History: Family History  Problem Relation Age of Onset   Diabetes Mother    High Cholesterol Mother     Diabetes Father    High Cholesterol Father    Allergic rhinitis Brother    Colon cancer Neg Hx    Esophageal cancer Neg Hx    Liver cancer Neg Hx    Pancreatic cancer Neg Hx    Rectal cancer Neg Hx    Stomach cancer Neg Hx      ROS: All others negative except as noted per HPI.   Objective: BP 118/68   Pulse 82   Temp 98 F (36.7 C) (Temporal)   Resp 16   Ht 5' 7.52" (1.715 m)   Wt 117 lb (53.1 kg)   SpO2 97%   BMI 18.04 kg/m  Body mass index is 18.04 kg/m.  General Appearance:  Alert, cooperative, no distress, appears stated age  Head:  Normocephalic, without obvious abnormality, atraumatic  Eyes:  Conjunctiva clear, EOM's intact  Nose: Nares normal,  erythematous nasal mucosa with yellow rhinnorhea , no visible anterior polyps, and septum midline  Throat: Lips, tongue normal; teeth and gums normal, no tonsillar exudate and + cobblestoning  Neck: Supple, symmetrical  Lungs:   clear to auscultation bilaterally, Respirations unlabored, no coughing  Heart:  regular rate and rhythm and no murmur, Appears well perfused  Extremities: No edema  Skin: Skin color, texture, turgor normal, no rashes or lesions on visualized portions of skin  Neurologic: No gross deficits   The plan was reviewed with the patient/family, and all questions/concerned were addressed.  It was my pleasure to see Allen [name] today and participate in his care. Please feel free to contact me with any questions or concerns.  Sincerely,  Ferol Luz, MD Allergy & Immunology  [facility] office: [phone] [facility] office: [phone]

## 2022-03-03 NOTE — Patient Instructions (Addendum)
Allergic  Rhinitis: not well  controlled  - Testing today showed positive to grass pollen, tree pollen and weed pollen  - Copy of test results provided.  - Avoidance measures provided. - Start taking:  Ryaltris 2 sprays per nostril daily (sample given in clinic today) , Zyrtec (cetirizine) 10mg  tablet once daily, and Singulair (montelukast) 10mg  daily - You can use an extra dose of the antihistamine, if needed, for breakthrough symptoms.  - Consider nasal saline rinses 1-2 times daily to remove allergens from the nasal cavities as well as help with mucous clearance (this is especially helpful to do before the nasal sprays are given) - Consider allergy shots as a means of long-term control and can reduce lifetime use of medications  - Allergy shots "re-train" and "reset" the immune system to ignore environmental allergens and decrease the resulting immune response to those allergens (sneezing, itchy watery eyes, runny nose, nasal congestion, etc).    - Allergy shots improve symptoms in 75-85%  - Allergy shots are the only potential permanent and disease modifying option  - We can discuss more at the next appointment if the medications are not working for you.  Allergic Conjunctivitis - Continue Allergen avoidance as instructed - Avoiding rubbing eyes, if irritated use a wet wash cloth to wipe allergen out of eyes  - Start Allergy Eye drops: great options include Pataday (Olopatadine) or Zaditor (ketotifen) for eye symptoms daily as needed-both sold over the counter if not covered by insurance.   -Avoid eye drops that say red eye relief as they may contain medications that dry out your eyes. - Consider allergen immunotherapy if symptoms worsen or you desire to reduce lifetime use of medications.    Follow up: 6 months   Thank you so much for letting me partake in your care today.  Don't hesitate to reach out if you have any additional concerns!  , MD  [facility] [place] [place]  Reducing Pollen Exposure  The American Academy of Allergy, Asthma and Immunology suggests the following steps to reduce your exposure to pollen during allergy seasons.    Do not hang sheets or clothing out to dry; pollen may collect on these items. Do not mow lawns or spend time around freshly cut grass; mowing stirs up pollen. Keep windows closed at night.  Keep car windows closed while driving. Minimize morning activities outdoors, a time when pollen counts are usually at their highest. Stay indoors as much as possible when pollen counts or humidity is high and on windy days when pollen tends to remain in the air longer. Use air conditioning when possible.  Many air conditioners have filters that trap the pollen spores. Use a HEPA room air filter to remove pollen form the indoor air you breathe.

## 2022-04-24 DIAGNOSIS — E782 Mixed hyperlipidemia: Secondary | ICD-10-CM | POA: Diagnosis not present

## 2022-04-24 DIAGNOSIS — J301 Allergic rhinitis due to pollen: Secondary | ICD-10-CM | POA: Diagnosis not present

## 2022-04-24 DIAGNOSIS — Z131 Encounter for screening for diabetes mellitus: Secondary | ICD-10-CM | POA: Diagnosis not present

## 2022-04-24 DIAGNOSIS — E559 Vitamin D deficiency, unspecified: Secondary | ICD-10-CM | POA: Diagnosis not present

## 2022-04-24 DIAGNOSIS — Z113 Encounter for screening for infections with a predominantly sexual mode of transmission: Secondary | ICD-10-CM | POA: Diagnosis not present

## 2022-04-24 DIAGNOSIS — Z0000 Encounter for general adult medical examination without abnormal findings: Secondary | ICD-10-CM | POA: Diagnosis not present

## 2022-04-24 DIAGNOSIS — Z1389 Encounter for screening for other disorder: Secondary | ICD-10-CM | POA: Diagnosis not present

## 2022-04-24 DIAGNOSIS — N4889 Other specified disorders of penis: Secondary | ICD-10-CM | POA: Diagnosis not present

## 2022-04-26 ENCOUNTER — Ambulatory Visit: Payer: Federal, State, Local not specified - PPO | Admitting: Internal Medicine

## 2022-04-27 DIAGNOSIS — N5201 Erectile dysfunction due to arterial insufficiency: Secondary | ICD-10-CM | POA: Diagnosis not present

## 2022-04-27 DIAGNOSIS — N341 Nonspecific urethritis: Secondary | ICD-10-CM | POA: Diagnosis not present

## 2023-03-20 DIAGNOSIS — J309 Allergic rhinitis, unspecified: Secondary | ICD-10-CM | POA: Diagnosis not present

## 2023-03-20 DIAGNOSIS — L299 Pruritus, unspecified: Secondary | ICD-10-CM | POA: Diagnosis not present

## 2023-03-20 DIAGNOSIS — J342 Deviated nasal septum: Secondary | ICD-10-CM | POA: Diagnosis not present

## 2023-04-06 ENCOUNTER — Other Ambulatory Visit: Payer: Self-pay | Admitting: Nurse Practitioner

## 2023-04-06 ENCOUNTER — Telehealth: Payer: Self-pay | Admitting: Emergency Medicine

## 2023-04-06 ENCOUNTER — Telehealth (INDEPENDENT_AMBULATORY_CARE_PROVIDER_SITE_OTHER): Payer: Federal, State, Local not specified - PPO | Admitting: Nurse Practitioner

## 2023-04-06 DIAGNOSIS — U071 COVID-19: Secondary | ICD-10-CM | POA: Diagnosis not present

## 2023-04-06 LAB — BASIC METABOLIC PANEL
BUN: 15 mg/dL (ref 6–23)
CO2: 31 mEq/L (ref 19–32)
Calcium: 9.8 mg/dL (ref 8.4–10.5)
Chloride: 103 mEq/L (ref 96–112)
Creatinine, Ser: 1.13 mg/dL (ref 0.40–1.50)
GFR: 81.82 mL/min (ref >60.00)
Glucose, Bld: 87 mg/dL (ref 70–99)
Potassium: 4 mEq/L (ref 3.5–5.1)
Sodium: 141 mEq/L (ref 135–145)

## 2023-04-06 MED ORDER — NIRMATRELVIR/RITONAVIR (PAXLOVID)TABLET: 3.0000 | ORAL_TABLET | Freq: Two times a day (BID) | ORAL | 0 refills | Status: AC

## 2023-04-06 MED ORDER — BENZONATATE 100 MG PO CAPS: 100.0000 mg | ORAL_CAPSULE | Freq: Two times a day (BID) | ORAL | 0 refills | Status: DC | PRN

## 2023-04-06 NOTE — Progress Notes (Signed)
   Established Patient Office Visit  An audio/visual tele-health visit was completed today for this patient. I connected with  Allen [name] on 04/06/23 utilizing audio/visual technology and verified that I am speaking with the correct person using two identifiers. The patient was located at their home, and I was located at the office of [facility] during the encounter. I discussed the limitations of evaluation and management by telemedicine. The patient expressed understanding and agreed to proceed.    Subjective   Patient ID: Allen [name], male    DOB: 03-Sep-1981  Age: 42 y.o. MRN: [id number]  Chief Complaint  Patient presents with   covid 19    Symptom onset 2 days ago.  Took a COVID-19 test yesterday and was positive.  Patient does have a past medical history of hyperlipidemia and prediabetes.  Does not have any vital signs available for review today.    Review of Systems  Constitutional:  Positive for malaise/fatigue.  HENT:  Positive for congestion and sore throat.        (+) sneezing  Respiratory:  Positive for cough. Negative for shortness of breath.   Cardiovascular:  Negative for chest pain.  Musculoskeletal:  Positive for back pain and myalgias.  Neurological:  Positive for headaches.      Objective:     There were no vitals taken for this visit.   Physical Exam Comprehensive physical exam not completed today as office visit was conducted remotely.  No acute distress noted.  Patient was alert and oriented, and appeared to have appropriate judgment.   No results found for any visits on 04/06/23.    The ASCVD Risk score (Arnett DK, et al., 2019) failed to calculate for the following reasons:   The 2019 ASCVD risk score is only valid for ages 64 to 75    Assessment & Plan:   Problem List Items Addressed This Visit       Other   COVID-19 - Primary    Acute Recommend symptom management with rest, proper hydration, moving throughout  the home as tolerated to promote lung expansion and blood circulation.  Recommend isolating away from others is much as possible until symptoms have improved/resolved. Per shared decision making he would like to be treated with Paxlovid.  Last kidney function testing collected around 2 years ago.  Patient is agreeable to coming to the lab today to have stat metabolic panel checked.  Pending these results we will plan on prescribing Paxlovid.  Patient also prescribed Tessalon Perles to manage cough. Call if symptoms worsen or do not improve.      Relevant Medications   benzonatate (TESSALON) 100 MG capsule   Other Relevant Orders   Basic metabolic panel    Return if symptoms worsen or fail to improve.    Elenore Paddy, NP

## 2023-04-06 NOTE — Assessment & Plan Note (Addendum)
Acute Recommend symptom management with rest, proper hydration, moving throughout the home as tolerated to promote lung expansion and blood circulation.  Recommend isolating away from others is much as possible until symptoms have improved/resolved. Per shared decision making he would like to be treated with Paxlovid.  Last kidney function testing collected around 2 years ago.  Patient is agreeable to coming to the lab today to have stat metabolic panel checked.  Pending these results we will plan on prescribing Paxlovid.  Patient also prescribed Tessalon Perles to manage cough. Call if symptoms worsen or do not improve.

## 2023-04-06 NOTE — Telephone Encounter (Signed)
Pt has not seen MD since 2022. He will need office visit.Marland KitchenRaechel Chute

## 2023-04-06 NOTE — Telephone Encounter (Signed)
Patient called and said he tested positive for Covid yesterday 04/05/2023. He wanted to know if he could be prescribed Paxlovid, but there are no appointments until 04/09/2023 and he did not want to be seen at a different office. He has mild symptoms of a scratchy throat, but not much else. I was advised to route a message back for clinical to make determination. Best callback is [phone].

## 2023-05-30 ENCOUNTER — Encounter: Payer: Self-pay | Admitting: Internal Medicine

## 2023-05-30 ENCOUNTER — Ambulatory Visit: Payer: Federal, State, Local not specified - PPO | Admitting: Internal Medicine

## 2023-05-30 VITALS — BP 120/80 | HR 65 | Temp 98.5°F | Ht 67.25 in | Wt 172.0 lb

## 2023-05-30 DIAGNOSIS — H60512 Acute actinic otitis externa, left ear: Secondary | ICD-10-CM | POA: Diagnosis not present

## 2023-05-30 DIAGNOSIS — H608X2 Other otitis externa, left ear: Secondary | ICD-10-CM | POA: Insufficient documentation

## 2023-05-30 MED ORDER — LIDOCAINE 5 % EX PTCH: 1.0000 | MEDICATED_PATCH | CUTANEOUS | 0 refills | Status: DC

## 2023-05-30 MED ORDER — NEOMYCIN-POLYMYXIN-HC 1 % OT SOLN: 3.0000 [drp] | Freq: Three times a day (TID) | OTIC | 0 refills | Status: DC

## 2023-05-30 NOTE — Progress Notes (Signed)
Subjective:   Patient ID: Allen [name], male    DOB: November 05, 1980, 42 y.o.   MRN: [id number]  HPI The patient is a 42 YO man coming in for pain in head and ear since flying home from [place]. No fevers or chills or cough or SOB.  Review of Systems  Constitutional: Negative.   HENT:  Positive for ear pain.   Eyes: Negative.   Respiratory:  Negative for cough, chest tightness and shortness of breath.   Cardiovascular:  Negative for chest pain, palpitations and leg swelling.  Gastrointestinal:  Negative for abdominal distention, abdominal pain, constipation, diarrhea, nausea and vomiting.  Musculoskeletal: Negative.   Skin: Negative.   Neurological:  Positive for headaches.  Psychiatric/Behavioral: Negative.      Objective:  Physical Exam Constitutional:      Appearance: He is well-developed.  HENT:     Head: Normocephalic and atraumatic.     Ears:     Comments: Left ear TM bulging and fluid with redness Cardiovascular:     Rate and Rhythm: Normal rate and regular rhythm.  Pulmonary:     Effort: Pulmonary effort is normal. No respiratory distress.     Breath sounds: Normal breath sounds. No wheezing or rales.  Abdominal:     General: Bowel sounds are normal. There is no distension.     Palpations: Abdomen is soft.     Tenderness: There is no abdominal tenderness. There is no rebound.  Musculoskeletal:     Cervical back: Normal range of motion.  Skin:    General: Skin is warm and dry.  Neurological:     Mental Status: He is alert and oriented to person, place, and time.     Coordination: Coordination normal.     There were no vitals filed for this visit.  Assessment & Plan:
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
## 2023-05-30 NOTE — Patient Instructions (Signed)
We have sent in the ear drops to use 3 drops 3 times a day for 3 days.

## 2023-06-12 ENCOUNTER — Ambulatory Visit (INDEPENDENT_AMBULATORY_CARE_PROVIDER_SITE_OTHER): Payer: Federal, State, Local not specified - PPO | Admitting: Emergency Medicine

## 2023-06-12 ENCOUNTER — Encounter: Payer: Self-pay | Admitting: Emergency Medicine

## 2023-06-12 VITALS — BP 128/70 | HR 63 | Temp 98.1°F | Ht 67.25 in | Wt 172.4 lb

## 2023-06-12 DIAGNOSIS — Z13228 Encounter for screening for other metabolic disorders: Secondary | ICD-10-CM

## 2023-06-12 DIAGNOSIS — Z1329 Encounter for screening for other suspected endocrine disorder: Secondary | ICD-10-CM

## 2023-06-12 DIAGNOSIS — Z0000 Encounter for general adult medical examination without abnormal findings: Secondary | ICD-10-CM | POA: Diagnosis not present

## 2023-06-12 DIAGNOSIS — Z13220 Encounter for screening for lipoid disorders: Secondary | ICD-10-CM | POA: Diagnosis not present

## 2023-06-12 DIAGNOSIS — Z130 Encounter for screening for diseases of the blood and blood-forming organs and certain disorders involving the immune mechanism: Secondary | ICD-10-CM

## 2023-06-12 DIAGNOSIS — Z125 Encounter for screening for malignant neoplasm of prostate: Secondary | ICD-10-CM | POA: Diagnosis not present

## 2023-06-12 LAB — COMPREHENSIVE METABOLIC PANEL
ALT: 20 U/L (ref 0–53)
AST: 20 U/L (ref 0–37)
Albumin: 4.5 g/dL (ref 3.5–5.2)
Alkaline Phosphatase: 59 U/L (ref 39–117)
BUN: 13 mg/dL (ref 6–23)
CO2: 28 meq/L (ref 19–32)
Calcium: 9.5 mg/dL (ref 8.4–10.5)
Chloride: 103 meq/L (ref 96–112)
Creatinine, Ser: 0.93 mg/dL (ref 0.40–1.50)
GFR: 103.24 mL/min (ref >60.00)
Glucose, Bld: 101 mg/dL — ABNORMAL HIGH (ref 70–99)
Potassium: 4.1 meq/L (ref 3.5–5.1)
Sodium: 141 meq/L (ref 135–145)
Total Bilirubin: 0.4 mg/dL (ref 0.2–1.2)
Total Protein: 7.4 g/dL (ref 6.0–8.3)

## 2023-06-12 LAB — CBC WITH DIFFERENTIAL/PLATELET
Basophils Absolute: 0 10*3/uL (ref 0.0–0.1)
Basophils Relative: 0.8 % (ref 0.0–3.0)
Eosinophils Absolute: 0.1 10*3/uL (ref 0.0–0.7)
Eosinophils Relative: 3.7 % (ref 0.0–5.0)
HCT: 46.9 % (ref 39.0–52.0)
Hemoglobin: 15 g/dL (ref 13.0–17.0)
Lymphocytes Relative: 48 % — ABNORMAL HIGH (ref 12.0–46.0)
Lymphs Abs: 1.7 10*3/uL (ref 0.7–4.0)
MCHC: 32 g/dL (ref 30.0–36.0)
MCV: 89.8 fL (ref 78.0–100.0)
Monocytes Absolute: 0.2 10*3/uL (ref 0.1–1.0)
Monocytes Relative: 5.9 % (ref 3.0–12.0)
Neutro Abs: 1.4 10*3/uL (ref 1.4–7.7)
Neutrophils Relative %: 41.6 % — ABNORMAL LOW (ref 43.0–77.0)
Platelets: 168 10*3/uL (ref 150.0–400.0)
RBC: 5.22 Mil/uL (ref 4.22–5.81)
RDW: 14 % (ref 11.5–15.5)
WBC: 3.5 10*3/uL — ABNORMAL LOW (ref 4.0–10.5)

## 2023-06-12 LAB — HEMOGLOBIN A1C: Hgb A1c MFr Bld: 5.8 % (ref 4.6–6.5)

## 2023-06-12 LAB — LIPID PANEL
Cholesterol: 251 mg/dL — ABNORMAL HIGH (ref 0–200)
HDL: 75 mg/dL (ref >39.00)
LDL Cholesterol: 155 mg/dL — ABNORMAL HIGH (ref 0–99)
NonHDL: 176.18
Total CHOL/HDL Ratio: 3
Triglycerides: 106 mg/dL (ref 0.0–149.0)
VLDL: 21.2 mg/dL (ref 0.0–40.0)

## 2023-06-12 LAB — PSA: PSA: 0.47 ng/mL (ref 0.10–4.00)

## 2023-06-12 NOTE — Progress Notes (Signed)
Allen [name] 42 y.o.   Chief Complaint  Patient presents with   Annual Exam    No concerns    HISTORY OF PRESENT ILLNESS: This is a 42 y.o. male here for annual exam. Healthy male with a healthy lifestyle. No chronic medical conditions. Overall doing well. Has not complaints or medical concerns today.  HPI   Prior to Admission medications   Medication Sig Start Date End Date Taking? Authorizing Provider  benzonatate (TESSALON) 100 MG capsule Take 1 capsule (100 mg total) by mouth 2 (two) times daily as needed for cough. Patient not taking: Reported on 06/12/2023 04/06/23   Elenore Paddy, NP  lidocaine (LIDODERM) 5 % Place 1 patch onto the skin daily. Remove & Discard patch within 12 hours or as directed by MD Patient not taking: Reported on 06/12/2023 05/30/23   Myrlene Broker, MD  meloxicam (MOBIC) 15 MG tablet Take 1 tablet (15 mg total) by mouth daily. Patient not taking: Reported on 06/12/2023 04/11/21   Georgina Quint, MD  montelukast (SINGULAIR) 10 MG tablet Take 1 tablet (10 mg total) by mouth at bedtime. Patient not taking: Reported on 06/12/2023 03/03/22   Ferol Luz, MD  Multiple Vitamin (MULTIVITAMIN) tablet Take 1 tablet by mouth as needed. Patient not taking: Reported on 06/12/2023    [provider]  NEOMYCIN-POLYMYXIN-HYDROCORTISONE (CORTISPORIN) 1 % SOLN OTIC solution Place 3 drops into the left ear in the morning, at noon, and at bedtime. Patient not taking: Reported on 06/12/2023 05/30/23   Myrlene Broker, MD  Olopatadine HCl 0.2 % SOLN Apply 1 drop to eye 2 (two) times daily. Patient not taking: Reported on 06/12/2023 03/03/22   Ferol Luz, MD  Olopatadine-Mometasone Cristal Generous) 445 [id number] 8085 MCG/ACT SUSP Place 2 sprays into the nose daily. Patient not taking: Reported on 06/12/2023 03/03/22   Ferol Luz, MD  VITAMIN D PO Take by mouth daily. Patient not taking: Reported on 06/12/2023    [provider]    No Known  Allergies  Patient Active Problem List   Diagnosis Date Noted   Actinic otitis externa of left ear 05/30/2023   Chronic right shoulder pain 04/11/2021   Dyslipidemia 04/11/2021   Prediabetes 04/11/2021   History of hypogonadism 04/11/2021   Gastroesophageal reflux disease with esophagitis 02/11/2019    Past Medical History:  Diagnosis Date   Allergies     Past Surgical History:  Procedure Laterality Date   None      Social History   Socioeconomic History   Marital status: Married    Spouse name: Not on file   Number of children: 0   Years of education: Not on file   Highest education level: Not on file  Occupational History   Occupation: Delivery  Tobacco Use   Smoking status: Never    Passive exposure: Past   Smokeless tobacco: Never  Vaping Use   Vaping status: Never Used  Substance and Sexual Activity   Alcohol use: Yes    Comment: Occassionlly    Drug use: No    Comment: No marijuana   Sexual activity: Yes  Other Topics Concern   Not on file  Social History Narrative   Not on file   Social Determinants of Health   Financial Resource Strain: Not on file  Food Insecurity: Low Risk  (03/20/2023)   Received from [facility]   Hunger Vital Sign    Worried About Running Out of Food in the Last Year: Never true    Ran  Out of Food in the Last Year: Never true  Transportation Needs: Not on file (03/20/2023)  Physical Activity: Not on file  Stress: Not on file  Social Connections: Not on file  Intimate Partner Violence: Not on file    Family History  Problem Relation Age of Onset   Diabetes Mother    High Cholesterol Mother    Diabetes Father    High Cholesterol Father    Allergic rhinitis Brother    Colon cancer Neg Hx    Esophageal cancer Neg Hx    Liver cancer Neg Hx    Pancreatic cancer Neg Hx    Rectal cancer Neg Hx    Stomach cancer Neg Hx      Review of Systems  Constitutional: Negative.  Negative for chills and fever.  HENT:  Negative.  Negative for congestion and sore throat.   Respiratory: Negative.  Negative for cough and shortness of breath.   Cardiovascular: Negative.  Negative for chest pain and palpitations.  Gastrointestinal:  Negative for abdominal pain, diarrhea, nausea and vomiting.  Genitourinary: Negative.  Negative for dysuria and hematuria.  Skin: Negative.  Negative for rash.  Neurological: Negative.  Negative for dizziness and headaches.  All other systems reviewed and are negative.   Vitals:   06/12/23 0930  BP: 128/70  Pulse: 63  Temp: 98.1 F (36.7 C)  SpO2: 97%    Physical Exam Vitals reviewed.  Constitutional:      Appearance: Normal appearance.  HENT:     Head: Normocephalic.     Right Ear: Tympanic membrane, ear canal and external ear normal.     Left Ear: Tympanic membrane, ear canal and external ear normal.     Mouth/Throat:     Mouth: Mucous membranes are moist.     Pharynx: Oropharynx is clear.  Eyes:     Extraocular Movements: Extraocular movements intact.     Conjunctiva/sclera: Conjunctivae normal.     Pupils: Pupils are equal, round, and reactive to light.  Cardiovascular:     Rate and Rhythm: Normal rate and regular rhythm.     Pulses: Normal pulses.     Heart sounds: Normal heart sounds.  Pulmonary:     Effort: Pulmonary effort is normal.     Breath sounds: Normal breath sounds.  Abdominal:     Palpations: Abdomen is soft.     Tenderness: There is no abdominal tenderness.  Musculoskeletal:     Cervical back: No tenderness.  Lymphadenopathy:     Cervical: No cervical adenopathy.  Skin:    General: Skin is warm and dry.     Capillary Refill: Capillary refill takes less than 2 seconds.  Neurological:     General: No focal deficit present.     Mental Status: He is alert and oriented to person, place, and time.  Psychiatric:        Mood and Affect: Mood normal.        Behavior: Behavior normal.      ASSESSMENT & PLAN: Problem List Items Addressed  This Visit   None Visit Diagnoses     Routine general medical examination at a health care facility    -  Primary   Relevant Orders   CBC with Differential   Comprehensive metabolic panel   Hemoglobin A1c   Lipid panel   PSA(Must document that pt has been informed of limitations of PSA testing.)   Screening for prostate cancer       Relevant Orders   PSA(Must  document that pt has been informed of limitations of PSA testing.)   Screening for deficiency anemia       Relevant Orders   CBC with Differential   Screening for lipoid disorders       Relevant Orders   Lipid panel   Screening for endocrine, metabolic and immunity disorder       Relevant Orders   Comprehensive metabolic panel   Hemoglobin A1c      Modifiable risk factors discussed with patient. Anticipatory guidance according to age provided. The following topics were also discussed: Social Determinants of Health Smoking.  Non-smoker Diet and nutrition.  Good eating habits Benefits of exercise.  Stays physically active Cancer family history review Vaccinations review and recommendations Cardiovascular risk assessment and need for blood work Mental health including depression and anxiety Fall and accident prevention  Patient Instructions  Health Maintenance, Male Adopting a healthy lifestyle and getting preventive care are important in promoting health and wellness. Ask your health care provider about: The right schedule for you to have regular tests and exams. Things you can do on your own to prevent diseases and keep yourself healthy. What should I know about diet, weight, and exercise? Eat a healthy diet  Eat a diet that includes plenty of vegetables, fruits, low-fat dairy products, and lean protein. Do not eat a lot of foods that are high in solid fats, added sugars, or sodium. Maintain a healthy weight Body mass index (BMI) is a measurement that can be used to identify possible weight problems. It  estimates body fat based on height and weight. Your health care provider can help determine your BMI and help you achieve or maintain a healthy weight. Get regular exercise Get regular exercise. This is one of the most important things you can do for your health. Most adults should: Exercise for at least 150 minutes each week. The exercise should increase your heart rate and make you sweat (moderate-intensity exercise). Do strengthening exercises at least twice a week. This is in addition to the moderate-intensity exercise. Spend less time sitting. Even light physical activity can be beneficial. Watch cholesterol and blood lipids Have your blood tested for lipids and cholesterol at 40 years of age, then have this test every 5 years. You may need to have your cholesterol levels checked more often if: Your lipid or cholesterol levels are high. You are older than 40 years of age. You are at high risk for heart disease. What should I know about cancer screening? Many types of cancers can be detected early and may often be prevented. Depending on your health history and family history, you may need to have cancer screening at various ages. This may include screening for: Colorectal cancer. Prostate cancer. Skin cancer. Lung cancer. What should I know about heart disease, diabetes, and high blood pressure? Blood pressure and heart disease High blood pressure causes heart disease and increases the risk of stroke. This is more likely to develop in people who have high blood pressure readings or are overweight. Talk with your health care provider about your target blood pressure readings. Have your blood pressure checked: Every 3-5 years if you are 59-17 years of age. Every year if you are 30 years old or older. If you are between the ages of 76 and 63 and are a current or former smoker, ask your health care provider if you should have a one-time screening for abdominal aortic aneurysm  (AAA). Diabetes Have regular diabetes screenings. This checks your fasting  blood sugar level. Have the screening done: Once every three years after age 38 if you are at a normal weight and have a low risk for diabetes. More often and at a younger age if you are overweight or have a high risk for diabetes. What should I know about preventing infection? Hepatitis B If you have a higher risk for hepatitis B, you should be screened for this virus. Talk with your health care provider to find out if you are at risk for hepatitis B infection. Hepatitis C Blood testing is recommended for: Everyone born from [birth year] through 1965. Anyone with known risk factors for hepatitis C. Sexually transmitted infections (STIs) You should be screened each year for STIs, including gonorrhea and chlamydia, if: You are sexually active and are younger than 40 years of age. You are older than 40 years of age and your health care provider tells you that you are at risk for this type of infection. Your sexual activity has changed since you were last screened, and you are at increased risk for chlamydia or gonorrhea. Ask your health care provider if you are at risk. Ask your health care provider about whether you are at high risk for HIV. Your health care provider may recommend a prescription medicine to help prevent HIV infection. If you choose to take medicine to prevent HIV, you should first get tested for HIV. You should then be tested every 3 months for as long as you are taking the medicine. Follow these instructions at home: Alcohol use Do not drink alcohol if your health care provider tells you not to drink. If you drink alcohol: Limit how much you have to 0-2 drinks a day. Know how much alcohol is in your drink. In the [place] one drink equals one 12 oz bottle of beer (355 mL), one 5 oz glass of wine (148 mL), or one 1 oz glass of hard liquor (44 mL). Lifestyle Do not use any products that contain nicotine or  tobacco. These products include cigarettes, chewing tobacco, and vaping devices, such as e-cigarettes. If you need help quitting, ask your health care provider. Do not use street drugs. Do not share needles. Ask your health care provider for help if you need support or information about quitting drugs. General instructions Schedule regular health, dental, and eye exams. Stay current with your vaccines. Tell your health care provider if: You often feel depressed. You have ever been abused or do not feel safe at home. Summary Adopting a healthy lifestyle and getting preventive care are important in promoting health and wellness. Follow your health care provider's instructions about healthy diet, exercising, and getting tested or screened for diseases. Follow your health care provider's instructions on monitoring your cholesterol and blood pressure. This information is not intended to replace advice given to you by your health care provider. Make sure you discuss any questions you have with your health care provider. Document Revised: 01/10/2021 Document Reviewed: 01/10/2021 Elsevier Patient Education  2024 Elsevier Inc.     Edwina Barth, MD [facility]

## 2023-06-12 NOTE — Patient Instructions (Signed)
Health Maintenance, Male Adopting a healthy lifestyle and getting preventive care are important in promoting health and wellness. Ask your health care provider about: The right schedule for you to have regular tests and exams. Things you can do on your own to prevent diseases and keep yourself healthy. What should I know about diet, weight, and exercise? Eat a healthy diet  Eat a diet that includes plenty of vegetables, fruits, low-fat dairy products, and lean protein. Do not eat a lot of foods that are high in solid fats, added sugars, or sodium. Maintain a healthy weight Body mass index (BMI) is a measurement that can be used to identify possible weight problems. It estimates body fat based on height and weight. Your health care provider can help determine your BMI and help you achieve or maintain a healthy weight. Get regular exercise Get regular exercise. This is one of the most important things you can do for your health. Most adults should: Exercise for at least 150 minutes each week. The exercise should increase your heart rate and make you sweat (moderate-intensity exercise). Do strengthening exercises at least twice a week. This is in addition to the moderate-intensity exercise. Spend less time sitting. Even light physical activity can be beneficial. Watch cholesterol and blood lipids Have your blood tested for lipids and cholesterol at 40 years of age, then have this test every 5 years. You may need to have your cholesterol levels checked more often if: Your lipid or cholesterol levels are high. You are older than 40 years of age. You are at high risk for heart disease. What should I know about cancer screening? Many types of cancers can be detected early and may often be prevented. Depending on your health history and family history, you may need to have cancer screening at various ages. This may include screening for: Colorectal cancer. Prostate cancer. Skin cancer. Lung  cancer. What should I know about heart disease, diabetes, and high blood pressure? Blood pressure and heart disease High blood pressure causes heart disease and increases the risk of stroke. This is more likely to develop in people who have high blood pressure readings or are overweight. Talk with your health care provider about your target blood pressure readings. Have your blood pressure checked: Every 3-5 years if you are 18-39 years of age. Every year if you are 40 years old or older. If you are between the ages of 65 and 75 and are a current or former smoker, ask your health care provider if you should have a one-time screening for abdominal aortic aneurysm (AAA). Diabetes Have regular diabetes screenings. This checks your fasting blood sugar level. Have the screening done: Once every three years after age 45 if you are at a normal weight and have a low risk for diabetes. More often and at a younger age if you are overweight or have a high risk for diabetes. What should I know about preventing infection? Hepatitis B If you have a higher risk for hepatitis B, you should be screened for this virus. Talk with your health care provider to find out if you are at risk for hepatitis B infection. Hepatitis C Blood testing is recommended for: Everyone born from 1945 through 1965. Anyone with known risk factors for hepatitis C. Sexually transmitted infections (STIs) You should be screened each year for STIs, including gonorrhea and chlamydia, if: You are sexually active and are younger than 40 years of age. You are older than 40 years of age and your   health care provider tells you that you are at risk for this type of infection. Your sexual activity has changed since you were last screened, and you are at increased risk for chlamydia or gonorrhea. Ask your health care provider if you are at risk. Ask your health care provider about whether you are at high risk for HIV. Your health care provider  may recommend a prescription medicine to help prevent HIV infection. If you choose to take medicine to prevent HIV, you should first get tested for HIV. You should then be tested every 3 months for as long as you are taking the medicine. Follow these instructions at home: Alcohol use Do not drink alcohol if your health care provider tells you not to drink. If you drink alcohol: Limit how much you have to 0-2 drinks a day. Know how much alcohol is in your drink. In the [place] one drink equals one 12 oz bottle of beer (355 mL), one 5 oz glass of wine (148 mL), or one 1 oz glass of hard liquor (44 mL). Lifestyle Do not use any products that contain nicotine or tobacco. These products include cigarettes, chewing tobacco, and vaping devices, such as e-cigarettes. If you need help quitting, ask your health care provider. Do not use street drugs. Do not share needles. Ask your health care provider for help if you need support or information about quitting drugs. General instructions Schedule regular health, dental, and eye exams. Stay current with your vaccines. Tell your health care provider if: You often feel depressed. You have ever been abused or do not feel safe at home. Summary Adopting a healthy lifestyle and getting preventive care are important in promoting health and wellness. Follow your health care provider's instructions about healthy diet, exercising, and getting tested or screened for diseases. Follow your health care provider's instructions on monitoring your cholesterol and blood pressure. This information is not intended to replace advice given to you by your health care provider. Make sure you discuss any questions you have with your health care provider. Document Revised: 01/10/2021 Document Reviewed: 01/10/2021 Elsevier Patient Education  2024 Elsevier Inc.  

## 2023-06-15 ENCOUNTER — Encounter: Payer: Self-pay | Admitting: Emergency Medicine

## 2023-06-15 NOTE — Telephone Encounter (Signed)
Called patient and informed him of his lab results.

## 2023-10-01 DIAGNOSIS — N5201 Erectile dysfunction due to arterial insufficiency: Secondary | ICD-10-CM | POA: Diagnosis not present

## 2023-10-01 DIAGNOSIS — N491 Inflammatory disorders of spermatic cord, tunica vaginalis and vas deferens: Secondary | ICD-10-CM | POA: Diagnosis not present

## 2023-10-08 DIAGNOSIS — E291 Testicular hypofunction: Secondary | ICD-10-CM | POA: Diagnosis not present

## 2023-11-08 DIAGNOSIS — N5201 Erectile dysfunction due to arterial insufficiency: Secondary | ICD-10-CM | POA: Diagnosis not present

## 2023-11-08 DIAGNOSIS — N491 Inflammatory disorders of spermatic cord, tunica vaginalis and vas deferens: Secondary | ICD-10-CM | POA: Diagnosis not present

## 2023-11-08 DIAGNOSIS — E291 Testicular hypofunction: Secondary | ICD-10-CM | POA: Diagnosis not present

## 2023-12-19 DIAGNOSIS — E291 Testicular hypofunction: Secondary | ICD-10-CM | POA: Diagnosis not present

## 2023-12-19 DIAGNOSIS — Z125 Encounter for screening for malignant neoplasm of prostate: Secondary | ICD-10-CM | POA: Diagnosis not present

## 2024-02-04 DIAGNOSIS — N5201 Erectile dysfunction due to arterial insufficiency: Secondary | ICD-10-CM | POA: Diagnosis not present

## 2024-02-04 DIAGNOSIS — E291 Testicular hypofunction: Secondary | ICD-10-CM | POA: Diagnosis not present

## 2024-03-27 ENCOUNTER — Emergency Department (HOSPITAL_COMMUNITY)

## 2024-03-27 ENCOUNTER — Emergency Department (HOSPITAL_COMMUNITY)
Admission: EM | Admit: 2024-03-27 | Discharge: 2024-03-27 | Disposition: A | Discharge status: Home / Self Care | Attending: Emergency Medicine | Admitting: Emergency Medicine

## 2024-03-27 ENCOUNTER — Other Ambulatory Visit: Payer: Self-pay

## 2024-03-27 DIAGNOSIS — S40012A Contusion of left shoulder, initial encounter: Secondary | ICD-10-CM | POA: Diagnosis not present

## 2024-03-27 DIAGNOSIS — S7002XA Contusion of left hip, initial encounter: Secondary | ICD-10-CM | POA: Insufficient documentation

## 2024-03-27 DIAGNOSIS — R519 Headache, unspecified: Secondary | ICD-10-CM | POA: Diagnosis not present

## 2024-03-27 DIAGNOSIS — M25512 Pain in left shoulder: Secondary | ICD-10-CM | POA: Diagnosis not present

## 2024-03-27 DIAGNOSIS — Y92410 Unspecified street and highway as the place of occurrence of the external cause: Secondary | ICD-10-CM | POA: Diagnosis not present

## 2024-03-27 DIAGNOSIS — S0990XA Unspecified injury of head, initial encounter: Secondary | ICD-10-CM | POA: Diagnosis not present

## 2024-03-27 DIAGNOSIS — M25552 Pain in left hip: Secondary | ICD-10-CM | POA: Diagnosis not present

## 2024-03-27 MED ORDER — IBUPROFEN 400 MG PO TABS: 400.0000 mg | ORAL_TABLET | Freq: Once | ORAL | Status: DC | PRN

## 2024-03-27 MED ORDER — IBUPROFEN 400 MG PO TABS
400.0000 mg | ORAL_TABLET | Freq: Once | ORAL | Status: AC
  Administered 2024-03-27: 400 mg via ORAL
  Filled 2024-03-27: qty 1

## 2024-03-27 MED ORDER — NAPROXEN 500 MG PO TABS
500.0000 mg | ORAL_TABLET | Freq: Two times a day (BID) | ORAL | 0 refills | Status: AC
Start: 2024-03-27 — End: ?

## 2024-03-27 NOTE — Discharge Instructions (Addendum)
 All the x-rays were normal today.  There is no sign of any damage to your brain and no broken bones.  However you will be very sore for the next 1 week and you can take the anti-inflammatories as needed for pain.
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
## 2024-06-17 ENCOUNTER — Ambulatory Visit (INDEPENDENT_AMBULATORY_CARE_PROVIDER_SITE_OTHER): Admitting: Emergency Medicine

## 2024-06-17 ENCOUNTER — Encounter: Payer: Self-pay | Admitting: Emergency Medicine

## 2024-06-17 ENCOUNTER — Ambulatory Visit: Payer: Self-pay | Admitting: Emergency Medicine

## 2024-06-17 VITALS — BP 120/82 | HR 78 | Temp 98.3°F | Ht 73.0 in | Wt 180.0 lb

## 2024-06-17 DIAGNOSIS — Z13228 Encounter for screening for other metabolic disorders: Secondary | ICD-10-CM | POA: Diagnosis not present

## 2024-06-17 DIAGNOSIS — Z1329 Encounter for screening for other suspected endocrine disorder: Secondary | ICD-10-CM

## 2024-06-17 DIAGNOSIS — Z0000 Encounter for general adult medical examination without abnormal findings: Secondary | ICD-10-CM | POA: Diagnosis not present

## 2024-06-17 DIAGNOSIS — Z130 Encounter for screening for diseases of the blood and blood-forming organs and certain disorders involving the immune mechanism: Secondary | ICD-10-CM

## 2024-06-17 DIAGNOSIS — Z13220 Encounter for screening for lipoid disorders: Secondary | ICD-10-CM | POA: Diagnosis not present

## 2024-06-17 LAB — CBC WITH DIFFERENTIAL/PLATELET
Basophils Absolute: 0 K/uL (ref 0.0–0.1)
Basophils Relative: 0.6 % (ref 0.0–3.0)
Eosinophils Absolute: 0.2 K/uL (ref 0.0–0.7)
Eosinophils Relative: 5.1 % — ABNORMAL HIGH (ref 0.0–5.0)
HCT: 42.6 % (ref 39.0–52.0)
Hemoglobin: 13.8 g/dL (ref 13.0–17.0)
Lymphocytes Relative: 43.3 % (ref 12.0–46.0)
Lymphs Abs: 1.4 K/uL (ref 0.7–4.0)
MCHC: 32.3 g/dL (ref 30.0–36.0)
MCV: 87.9 fl (ref 78.0–100.0)
Monocytes Absolute: 0.2 K/uL (ref 0.1–1.0)
Monocytes Relative: 7.2 % (ref 3.0–12.0)
Neutro Abs: 1.4 K/uL (ref 1.4–7.7)
Neutrophils Relative %: 43.8 % (ref 43.0–77.0)
Platelets: 155 K/uL (ref 150.0–400.0)
RBC: 4.85 Mil/uL (ref 4.22–5.81)
RDW: 15 % (ref 11.5–15.5)
WBC: 3.2 K/uL — ABNORMAL LOW (ref 4.0–10.5)

## 2024-06-17 LAB — COMPREHENSIVE METABOLIC PANEL WITH GFR
ALT: 23 U/L (ref 0–53)
AST: 22 U/L (ref 0–37)
Albumin: 4.7 g/dL (ref 3.5–5.2)
Alkaline Phosphatase: 54 U/L (ref 39–117)
BUN: 15 mg/dL (ref 6–23)
CO2: 33 meq/L — ABNORMAL HIGH (ref 19–32)
Calcium: 9.5 mg/dL (ref 8.4–10.5)
Chloride: 103 meq/L (ref 96–112)
Creatinine, Ser: 1.01 mg/dL (ref 0.40–1.50)
GFR: 92.84 mL/min (ref >60.00)
Glucose, Bld: 97 mg/dL (ref 70–99)
Potassium: 4.1 meq/L (ref 3.5–5.1)
Sodium: 142 meq/L (ref 135–145)
Total Bilirubin: 0.6 mg/dL (ref 0.2–1.2)
Total Protein: 7.7 g/dL (ref 6.0–8.3)

## 2024-06-17 LAB — LIPID PANEL
Cholesterol: 277 mg/dL — ABNORMAL HIGH (ref 0–200)
HDL: 63.7 mg/dL (ref >39.00)
LDL Cholesterol: 185 mg/dL — ABNORMAL HIGH (ref 0–99)
NonHDL: 213.24
Total CHOL/HDL Ratio: 4
Triglycerides: 142 mg/dL (ref 0.0–149.0)
VLDL: 28.4 mg/dL (ref 0.0–40.0)

## 2024-06-17 LAB — HEMOGLOBIN A1C: Hgb A1c MFr Bld: 6.3 % (ref 4.6–6.5)

## 2024-06-17 NOTE — Patient Instructions (Signed)
 Health Maintenance, Male  Adopting a healthy lifestyle and getting preventive care are important in promoting health and wellness. Ask your health care provider about:  The right schedule for you to have regular tests and exams.  Things you can do on your own to prevent diseases and keep yourself healthy.  What should I know about diet, weight, and exercise?  Eat a healthy diet    Eat a diet that includes plenty of vegetables, fruits, low-fat dairy products, and lean protein.  Do not eat a lot of foods that are high in solid fats, added sugars, or sodium.  Maintain a healthy weight  Body mass index (BMI) is a measurement that can be used to identify possible weight problems. It estimates body fat based on height and weight. Your health care provider can help determine your BMI and help you achieve or maintain a healthy weight.  Get regular exercise  Get regular exercise. This is one of the most important things you can do for your health. Most adults should:  Exercise for at least 150 minutes each week. The exercise should increase your heart rate and make you sweat (moderate-intensity exercise).  Do strengthening exercises at least twice a week. This is in addition to the moderate-intensity exercise.  Spend less time sitting. Even light physical activity can be beneficial.  Watch cholesterol and blood lipids  Have your blood tested for lipids and cholesterol at 41 years of age, then have this test every 5 years.  You may need to have your cholesterol levels checked more often if:  Your lipid or cholesterol levels are high.  You are older than 41 years of age.  You are at high risk for heart disease.  What should I know about cancer screening?  Many types of cancers can be detected early and may often be prevented. Depending on your health history and family history, you may need to have cancer screening at various ages. This may include screening for:  Colorectal cancer.  Prostate cancer.  Skin cancer.  Lung  cancer.  What should I know about heart disease, diabetes, and high blood pressure?  Blood pressure and heart disease  High blood pressure causes heart disease and increases the risk of stroke. This is more likely to develop in people who have high blood pressure readings or are overweight.  Talk with your health care provider about your target blood pressure readings.  Have your blood pressure checked:  Every 3-5 years if you are 41-52 years of age.  Every year if you are 3 years old or older.  If you are between the ages of 60 and 72 and are a current or former smoker, ask your health care provider if you should have a one-time screening for abdominal aortic aneurysm (AAA).  Diabetes  Have regular diabetes screenings. This checks your fasting blood sugar level. Have the screening done:  Once every three years after age 41 if you are at a normal weight and have a low risk for diabetes.  More often and at a younger age if you are overweight or have a high risk for diabetes.  What should I know about preventing infection?  Hepatitis B  If you have a higher risk for hepatitis B, you should be screened for this virus. Talk with your health care provider to find out if you are at risk for hepatitis B infection.  Hepatitis C  Blood testing is recommended for:  Everyone born from [birth year] through 1963.  Anyone  with known risk factors for hepatitis C.  Sexually transmitted infections (STIs)  You should be screened each year for STIs, including gonorrhea and chlamydia, if:  You are sexually active and are younger than 41 years of age.  You are older than 41 years of age and your health care provider tells you that you are at risk for this type of infection.  Your sexual activity has changed since you were last screened, and you are at increased risk for chlamydia or gonorrhea. Ask your health care provider if you are at risk.  Ask your health care provider about whether you are at high risk for HIV. Your health care provider  may recommend a prescription medicine to help prevent HIV infection. If you choose to take medicine to prevent HIV, you should first get tested for HIV. You should then be tested every 3 months for as long as you are taking the medicine.  Follow these instructions at home:  Alcohol use  Do not drink alcohol if your health care provider tells you not to drink.  If you drink alcohol:  Limit how much you have to 0-2 drinks a day.  Know how much alcohol is in your drink. In the [place] one drink equals one 12 oz bottle of beer (355 mL), one 5 oz glass of wine (148 mL), or one 1 oz glass of hard liquor (44 mL).  Lifestyle  Do not use any products that contain nicotine or tobacco. These products include cigarettes, chewing tobacco, and vaping devices, such as e-cigarettes. If you need help quitting, ask your health care provider.  Do not use street drugs.  Do not share needles.  Ask your health care provider for help if you need support or information about quitting drugs.  General instructions  Schedule regular health, dental, and eye exams.  Stay current with your vaccines.  Tell your health care provider if:  You often feel depressed.  You have ever been abused or do not feel safe at home.  Summary  Adopting a healthy lifestyle and getting preventive care are important in promoting health and wellness.  Follow your health care provider's instructions about healthy diet, exercising, and getting tested or screened for diseases.  Follow your health care provider's instructions on monitoring your cholesterol and blood pressure.  This information is not intended to replace advice given to you by your health care provider. Make sure you discuss any questions you have with your health care provider.  Document Revised: 01/10/2021 Document Reviewed: 01/10/2021  Elsevier Patient Education  2024 [employer].

## 2024-06-17 NOTE — Progress Notes (Signed)
 Allen [name] 43 y.o.   Chief Complaint  Patient presents with   Annual Exam    Patient here for physical     HISTORY OF PRESENT ILLNESS: This is a 43 y.o. male here for annual physical. Overall doing well. Has no complaints or medical concerns today.  HPI   Prior to Admission medications   Medication Sig Start Date End Date Taking? Authorizing Provider  naproxen  (NAPROSYN ) 500 MG tablet Take 1 tablet (500 mg total) by mouth 2 (two) times daily. 03/27/24   Doretha Folks, MD    No Known Allergies  Patient Active Problem List   Diagnosis Date Noted   Chronic right shoulder pain 04/11/2021   Dyslipidemia 04/11/2021   Prediabetes 04/11/2021   History of hypogonadism 04/11/2021   Gastroesophageal reflux disease with esophagitis 02/11/2019    Past Medical History:  Diagnosis Date   Allergies     Past Surgical History:  Procedure Laterality Date   None      Social History   Socioeconomic History   Marital status: Married    Spouse name: Not on file   Number of children: 0   Years of education: Not on file   Highest education level: Not on file  Occupational History   Occupation: Delivery  Tobacco Use   Smoking status: Never    Passive exposure: Past   Smokeless tobacco: Never  Vaping Use   Vaping status: Never Used  Substance and Sexual Activity   Alcohol use: Yes    Comment: Occassionlly    Drug use: No    Comment: No marijuana   Sexual activity: Yes  Other Topics Concern   Not on file  Social History Narrative   Not on file   Social Drivers of Health   Financial Resource Strain: Not on file  Food Insecurity: Low Risk  (03/20/2023)   Received from [facility]   Hunger Vital Sign    Within the past 12 months, you worried that your food would run out before you got money to buy more: Never true    Within the past 12 months, the food you bought just didn't last and you didn't have money to get more. : Never true  Transportation Needs: Not on  file (03/20/2023)  Physical Activity: Not on file  Stress: Not on file  Social Connections: Not on file  Intimate Partner Violence: Not on file    Family History  Problem Relation Age of Onset   Diabetes Mother    High Cholesterol Mother    Diabetes Father    High Cholesterol Father    Allergic rhinitis Brother    Colon cancer Neg Hx    Esophageal cancer Neg Hx    Liver cancer Neg Hx    Pancreatic cancer Neg Hx    Rectal cancer Neg Hx    Stomach cancer Neg Hx      Review of Systems  Constitutional: Negative.  Negative for chills and fever.  HENT: Negative.  Negative for congestion and sore throat.   Respiratory: Negative.  Negative for cough and shortness of breath.   Cardiovascular: Negative.  Negative for chest pain and palpitations.  Gastrointestinal:  Negative for abdominal pain, diarrhea, nausea and vomiting.  Genitourinary: Negative.  Negative for dysuria and hematuria.  Skin: Negative.  Negative for rash.  Neurological: Negative.  Negative for dizziness and headaches.  All other systems reviewed and are negative.   Today's Vitals   06/17/24 1255  TempSrc: Oral  Weight: 180 lb (  81.6 kg)  Height: 6' 1 (1.854 m)   Body mass index is 23.75 kg/m.   Physical Exam Vitals reviewed.  Constitutional:      Appearance: Normal appearance.  HENT:     Head: Normocephalic.     Right Ear: Tympanic membrane, ear canal and external ear normal.     Left Ear: Tympanic membrane, ear canal and external ear normal.     Mouth/Throat:     Mouth: Mucous membranes are moist.     Pharynx: Oropharynx is clear.  Eyes:     Extraocular Movements: Extraocular movements intact.     Conjunctiva/sclera: Conjunctivae normal.     Pupils: Pupils are equal, round, and reactive to light.  Cardiovascular:     Rate and Rhythm: Normal rate and regular rhythm.     Pulses: Normal pulses.     Heart sounds: Normal heart sounds.  Pulmonary:     Effort: Pulmonary effort is normal.     Breath  sounds: Normal breath sounds.  Abdominal:     Palpations: Abdomen is soft.     Tenderness: There is no abdominal tenderness.  Musculoskeletal:     Cervical back: No tenderness.  Lymphadenopathy:     Cervical: No cervical adenopathy.  Skin:    General: Skin is warm and dry.     Capillary Refill: Capillary refill takes less than 2 seconds.  Neurological:     General: No focal deficit present.     Mental Status: He is alert and oriented to person, place, and time.  Psychiatric:        Mood and Affect: Mood normal.        Behavior: Behavior normal.      ASSESSMENT & PLAN: Problem List Items Addressed This Visit   None Visit Diagnoses       Routine general medical examination at a health care facility    -  Primary   Relevant Orders   CBC with Differential/Platelet   Hemoglobin A1c   Comprehensive metabolic panel with GFR   Lipid panel     Screening for deficiency anemia       Relevant Orders   CBC with Differential/Platelet     Screening for lipoid disorders       Relevant Orders   Lipid panel     Screening for endocrine, metabolic and immunity disorder       Relevant Orders   Hemoglobin A1c   Comprehensive metabolic panel with GFR         Modifiable risk factors discussed with patient. Anticipatory guidance according to age provided. The following topics were also discussed: Social Determinants of Health Smoking.  Non-smoker Diet and nutrition Benefits of exercise Cancer family history review Vaccinations review and recommendations Cardiovascular risk assessment and need for blood work Mental health including depression and anxiety Fall and accident prevention  Patient Instructions  Health Maintenance, Male Adopting a healthy lifestyle and getting preventive care are important in promoting health and wellness. Ask your health care provider about: The right schedule for you to have regular tests and exams. Things you can do on your own to prevent diseases  and keep yourself healthy. What should I know about diet, weight, and exercise? Eat a healthy diet  Eat a diet that includes plenty of vegetables, fruits, low-fat dairy products, and lean protein. Do not eat a lot of foods that are high in solid fats, added sugars, or sodium. Maintain a healthy weight Body mass index (BMI) is a measurement that can  be used to identify possible weight problems. It estimates body fat based on height and weight. Your health care provider can help determine your BMI and help you achieve or maintain a healthy weight. Get regular exercise Get regular exercise. This is one of the most important things you can do for your health. Most adults should: Exercise for at least 150 minutes each week. The exercise should increase your heart rate and make you sweat (moderate-intensity exercise). Do strengthening exercises at least twice a week. This is in addition to the moderate-intensity exercise. Spend less time sitting. Even light physical activity can be beneficial. Watch cholesterol and blood lipids Have your blood tested for lipids and cholesterol at 41 years of age, then have this test every 5 years. You may need to have your cholesterol levels checked more often if: Your lipid or cholesterol levels are high. You are older than 41 years of age. You are at high risk for heart disease. What should I know about cancer screening? Many types of cancers can be detected early and may often be prevented. Depending on your health history and family history, you may need to have cancer screening at various ages. This may include screening for: Colorectal cancer. Prostate cancer. Skin cancer. Lung cancer. What should I know about heart disease, diabetes, and high blood pressure? Blood pressure and heart disease High blood pressure causes heart disease and increases the risk of stroke. This is more likely to develop in people who have high blood pressure readings or are  overweight. Talk with your health care provider about your target blood pressure readings. Have your blood pressure checked: Every 3-5 years if you are 39-60 years of age. Every year if you are 41 years old or older. If you are between the ages of 17 and 6 and are a current or former smoker, ask your health care provider if you should have a one-time screening for abdominal aortic aneurysm (AAA). Diabetes Have regular diabetes screenings. This checks your fasting blood sugar level. Have the screening done: Once every three years after age 48 if you are at a normal weight and have a low risk for diabetes. More often and at a younger age if you are overweight or have a high risk for diabetes. What should I know about preventing infection? Hepatitis B If you have a higher risk for hepatitis B, you should be screened for this virus. Talk with your health care provider to find out if you are at risk for hepatitis B infection. Hepatitis C Blood testing is recommended for: Everyone born from [birth year] through 1963. Anyone with known risk factors for hepatitis C. Sexually transmitted infections (STIs) You should be screened each year for STIs, including gonorrhea and chlamydia, if: You are sexually active and are younger than 41 years of age. You are older than 41 years of age and your health care provider tells you that you are at risk for this type of infection. Your sexual activity has changed since you were last screened, and you are at increased risk for chlamydia or gonorrhea. Ask your health care provider if you are at risk. Ask your health care provider about whether you are at high risk for HIV. Your health care provider may recommend a prescription medicine to help prevent HIV infection. If you choose to take medicine to prevent HIV, you should first get tested for HIV. You should then be tested every 3 months for as long as you are taking the medicine. Follow these  instructions at  home: Alcohol use Do not drink alcohol if your health care provider tells you not to drink. If you drink alcohol: Limit how much you have to 0-2 drinks a day. Know how much alcohol is in your drink. In the [place] one drink equals one 12 oz bottle of beer (355 mL), one 5 oz glass of wine (148 mL), or one 1 oz glass of hard liquor (44 mL). Lifestyle Do not use any products that contain nicotine or tobacco. These products include cigarettes, chewing tobacco, and vaping devices, such as e-cigarettes. If you need help quitting, ask your health care provider. Do not use street drugs. Do not share needles. Ask your health care provider for help if you need support or information about quitting drugs. General instructions Schedule regular health, dental, and eye exams. Stay current with your vaccines. Tell your health care provider if: You often feel depressed. You have ever been abused or do not feel safe at home. Summary Adopting a healthy lifestyle and getting preventive care are important in promoting health and wellness. Follow your health care provider's instructions about healthy diet, exercising, and getting tested or screened for diseases. Follow your health care provider's instructions on monitoring your cholesterol and blood pressure. This information is not intended to replace advice given to you by your health care provider. Make sure you discuss any questions you have with your health care provider. Document Revised: 01/10/2021 Document Reviewed: 01/10/2021 Elsevier Patient Education  2024 [employer]     Emil Schaumann, MD [facility]
